# Patient Record
Sex: Male | Born: 1975
Health system: Southern US, Community
[De-identification: ages and names within clinical notes are randomized; demographics above are authoritative.]

## PROBLEM LIST (undated history)

## (undated) DIAGNOSIS — K143 Hypertrophy of tongue papillae: Secondary | ICD-10-CM

## (undated) DIAGNOSIS — G4733 Obstructive sleep apnea (adult) (pediatric): Secondary | ICD-10-CM

## (undated) DIAGNOSIS — E785 Hyperlipidemia, unspecified: Secondary | ICD-10-CM

## (undated) DIAGNOSIS — T7840XA Allergy, unspecified, initial encounter: Secondary | ICD-10-CM

## (undated) HISTORY — DX: Allergy, unspecified, initial encounter: T78.40XA

## (undated) HISTORY — DX: Hyperlipidemia, unspecified: E78.5

## (undated) HISTORY — DX: Hypertrophy of tongue papillae: K14.3

---
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
## 2006-09-20 ENCOUNTER — Emergency Department: Payer: Self-pay

## 2006-11-06 ENCOUNTER — Emergency Department: Payer: Self-pay | Admitting: General Practice

## 2008-08-03 ENCOUNTER — Ambulatory Visit: Payer: Self-pay | Admitting: Family Medicine

## 2008-10-22 DIAGNOSIS — J302 Other seasonal allergic rhinitis: Secondary | ICD-10-CM | POA: Insufficient documentation

## 2009-01-06 ENCOUNTER — Ambulatory Visit: Payer: Self-pay | Admitting: Urology

## 2009-03-05 ENCOUNTER — Ambulatory Visit: Payer: Self-pay | Admitting: Urology

## 2009-08-25 ENCOUNTER — Encounter: Admission: RE | Admit: 2009-08-25 | Discharge: 2009-08-25 | Payer: Self-pay | Admitting: Neurosurgery

## 2011-08-15 ENCOUNTER — Emergency Department: Payer: Self-pay | Admitting: Emergency Medicine

## 2015-08-26 ENCOUNTER — Encounter: Payer: Self-pay | Admitting: Family Medicine

## 2015-08-26 ENCOUNTER — Ambulatory Visit (INDEPENDENT_AMBULATORY_CARE_PROVIDER_SITE_OTHER): Payer: 59 | Admitting: Family Medicine

## 2015-08-26 VITALS — BP 110/80 | HR 106 | Temp 97.8°F | Resp 18 | Ht 71.0 in | Wt 171.2 lb

## 2015-08-26 DIAGNOSIS — M5412 Radiculopathy, cervical region: Secondary | ICD-10-CM | POA: Insufficient documentation

## 2015-08-26 DIAGNOSIS — Z841 Family history of disorders of kidney and ureter: Secondary | ICD-10-CM | POA: Diagnosis not present

## 2015-08-26 DIAGNOSIS — Z79899 Other long term (current) drug therapy: Secondary | ICD-10-CM

## 2015-08-26 DIAGNOSIS — N200 Calculus of kidney: Secondary | ICD-10-CM | POA: Insufficient documentation

## 2015-08-26 DIAGNOSIS — Z0000 Encounter for general adult medical examination without abnormal findings: Secondary | ICD-10-CM

## 2015-08-26 DIAGNOSIS — K143 Hypertrophy of tongue papillae: Secondary | ICD-10-CM | POA: Insufficient documentation

## 2015-08-26 DIAGNOSIS — Z13220 Encounter for screening for lipoid disorders: Secondary | ICD-10-CM | POA: Diagnosis not present

## 2015-08-26 DIAGNOSIS — Z130 Encounter for screening for diseases of the blood and blood-forming organs and certain disorders involving the immune mechanism: Secondary | ICD-10-CM | POA: Diagnosis not present

## 2015-08-26 DIAGNOSIS — Z131 Encounter for screening for diabetes mellitus: Secondary | ICD-10-CM | POA: Diagnosis not present

## 2015-08-26 DIAGNOSIS — B360 Pityriasis versicolor: Secondary | ICD-10-CM

## 2015-08-26 DIAGNOSIS — M5020 Other cervical disc displacement, unspecified cervical region: Secondary | ICD-10-CM | POA: Insufficient documentation

## 2015-08-26 MED ORDER — IBUPROFEN 800 MG PO TABS: 800.0000 mg | ORAL_TABLET | Freq: Three times a day (TID) | ORAL | Status: DC | PRN

## 2015-08-26 NOTE — Progress Notes (Signed)
Name: Nicolas White   MRN: [id number]    DOB: Nov 07, 1975   Date:08/26/2015       Progress Note  Subjective  Chief Complaint  Chief Complaint  Patient presents with  . Annual Exam    patient has questions about his immune system.   . Labs Only    HPI  Male Exam: he is feeling well, he would like to have lab work. No sexual dysfunction, no dysuria, no dribbling, no nocturia.   Neck pain: doing better, intermittent pain and tingling down left shoulder. Takes Ibuprofen prn and needs a refill.     Patient Active Problem List   Diagnosis Date Noted  . Displacement of cervical intervertebral disc without myelopathy 08/26/2015  . Calculus of kidney 08/26/2015  . Cervical radiculitis 08/26/2015  . Hypertrophy of tongue papillae 08/26/2015  . Allergic rhinitis 10/22/2008    History reviewed. No pertinent past surgical history.  Family History  Problem Relation Age of Onset  . Arthritis Mother   . Hypertension Mother   . Heart attack Mother     hx of light heart attack  . Hyperlipidemia Mother   . Cancer Father     lung  . Kidney disease Brother     had a transplant (hemodialysis)    Social History   Social History  . Marital Status: Married    Spouse Name: N/A  . Number of Children: N/A  . Years of Education: N/A   Occupational History  . Not on file.   Social History Main Topics  . Smoking status: Current Every Day Smoker  . Smokeless tobacco: Not on file     Comment: patient has chewed tobacco for 10 yrs.  . Alcohol Use: No  . Drug Use: No  . Sexual Activity:    Partners: Female   Other Topics Concern  . Not on file   Social History Narrative  . No narrative on file     Current outpatient prescriptions:  .  ibuprofen (ADVIL,MOTRIN) 800 MG tablet, Take 1 tablet (800 mg total) by mouth every 8 (eight) hours as needed., Disp: 90 tablet, Rfl: 0  No Known Allergies   ROS  Constitutional: Negative for fever, positive for  weight change.   Respiratory: Negative for cough and shortness of breath.   Cardiovascular: Negative for chest pain or palpitations.  Gastrointestinal: Negative for abdominal pain, no bowel changes.  Musculoskeletal: Negative for gait problem or joint swelling.  Skin: Negative for rash.  Neurological: Negative for dizziness or headache.  No other specific complaints in a complete review of systems (except as listed in HPI above).  Objective  Filed Vitals:   08/26/15 0833  BP: 110/80  Pulse: 106  Temp: 97.8 F (36.6 C)  TempSrc: Oral  Resp: 18  Height:  (1.803 m)  Weight: 171 lb 3.2 oz (77.656 kg)  SpO2: 97%    Body mass index is 23.89 kg/(m^2).  Physical Exam  Constitutional: Patient appears well-developed and well-nourished. No distress.  HENT: Head: Normocephalic and atraumatic. Ears: B TMs ok, no erythema or effusion; Nose: Nose normal. Mouth/Throat: Oropharynx is clear and moist. No oropharyngeal exudate.  Eyes: Conjunctivae and EOM are normal. Pupils are equal, round, and reactive to light. No scleral icterus.  Neck: Normal range of motion. Neck supple. No JVD present. No thyromegaly present.  Cardiovascular: Normal rate, regular rhythm and normal heart sounds.  No murmur heard. No BLE edema. Pulmonary/Chest: Effort normal and breath sounds normal. No respiratory distress. Abdominal:  Soft. Bowel sounds are normal, no distension. There is no tenderness. no masses MALE GENITALIA: Right testis moved upwards towards the inguinal area, but both descended, no masses palpated, no hernias, no lesions, no discharge RECTAL: discussed USPTF - he chose not to have it done Musculoskeletal: Normal range of motion, no joint effusions. No gross deformities Neurological: he is alert and oriented to person, place, and time. No cranial nerve deficit. Coordination, balance, strength, speech and gait are normal.  Skin: Skin is warm and dry.Hypochromic patches on his back- he does not want to be treated  No erythema.  Psychiatric: Patient has a normal mood and affect. behavior is normal. Judgment and thought content normal.  PHQ2/9: Depression screen PHQ 2/9 08/26/2015  Decreased Interest 0  Down, Depressed, Hopeless 0  PHQ - 2 Score 0    Fall Risk: Fall Risk  08/26/2015  Falls in the past year? No    Functional Status Survey: Is the patient deaf or have difficulty hearing?: No Does the patient have difficulty seeing, even when wearing glasses/contacts?: No Does the patient have difficulty concentrating, remembering, or making decisions?: No Does the patient have difficulty walking or climbing stairs?: No Does the patient have difficulty dressing or bathing?: No Does the patient have difficulty doing errands alone such as visiting a doctor's office or shopping?: No    Assessment & Plan  1. Physical exam, annual  Discussed importance of 150 minutes of physical activity weekly, eat two servings of fish weekly, eat one serving of tree nuts ( cashews, pistachios, pecans, almonds.Marland Kitchen) every other day, eat 6 servings of fruit/vegetables daily and drink plenty of water and avoid sweet beverages.   2. Encounter for screening for diabetes mellitus  Check glucose  3. Lipid screening  - Lipid Profile  4. Family history of renal failure  Check GFR  5. Screening, anemia, deficiency, iron  - Hematocrit  6. Encounter for long-term current use of medication  - Glucose - Comprehensive metabolic panel

## 2015-08-26 NOTE — Patient Instructions (Signed)
Discussed importance of 150 minutes of physical activity weekly, eat two servings of fish weekly, eat one serving of tree nuts ( cashews, pistachios, pecans, almonds..) every other day, eat 6 servings of fruit/vegetables daily and drink plenty of water and avoid sweet beverages. 

## 2015-08-27 ENCOUNTER — Telehealth: Payer: Self-pay

## 2015-08-27 ENCOUNTER — Other Ambulatory Visit: Payer: Self-pay | Admitting: Family Medicine

## 2015-08-27 DIAGNOSIS — E785 Hyperlipidemia, unspecified: Secondary | ICD-10-CM

## 2015-08-27 LAB — LIPID PANEL
Chol/HDL Ratio: 7.1 ratio units — ABNORMAL HIGH (ref 0.0–5.0)
Cholesterol, Total: 283 mg/dL — ABNORMAL HIGH (ref 100–199)
HDL: 40 mg/dL (ref >39)
LDL Calculated: 215 mg/dL — ABNORMAL HIGH (ref 0–99)
Triglycerides: 138 mg/dL (ref 0–149)
VLDL Cholesterol Cal: 28 mg/dL (ref 5–40)

## 2015-08-27 LAB — COMPREHENSIVE METABOLIC PANEL
ALT: 35 IU/L (ref 0–44)
AST: 25 IU/L (ref 0–40)
Albumin/Globulin Ratio: 1.5 (ref 1.1–2.5)
Albumin: 4.8 g/dL (ref 3.5–5.5)
Alkaline Phosphatase: 96 IU/L (ref 39–117)
BUN/Creatinine Ratio: 15 (ref 9–20)
BUN: 15 mg/dL (ref 6–24)
Bilirubin Total: 0.6 mg/dL (ref 0.0–1.2)
CO2: 27 mmol/L (ref 18–29)
Calcium: 9.9 mg/dL (ref 8.7–10.2)
Chloride: 98 mmol/L (ref 96–106)
Creatinine, Ser: 1.01 mg/dL (ref 0.76–1.27)
GFR calc Af Amer: 107 mL/min/{1.73_m2} (ref >59)
GFR calc non Af Amer: 93 mL/min/{1.73_m2} (ref >59)
Globulin, Total: 3.2 g/dL (ref 1.5–4.5)
Glucose: 92 mg/dL (ref 65–99)
Potassium: 4.1 mmol/L (ref 3.5–5.2)
Sodium: 140 mmol/L (ref 134–144)
Total Protein: 8 g/dL (ref 6.0–8.5)

## 2015-08-27 LAB — HEMATOCRIT: Hematocrit: 51.2 % — ABNORMAL HIGH (ref 37.5–51.0)

## 2015-08-27 MED ORDER — ATORVASTATIN CALCIUM 40 MG PO TABS: 40.0000 mg | ORAL_TABLET | Freq: Every day | ORAL | Status: DC

## 2015-08-27 NOTE — Telephone Encounter (Signed)
Patient returned my call and labs were reviewed.  Patient has been scheduled for an appt on 11/25/15 @ 3:20pm.

## 2015-09-01 ENCOUNTER — Telehealth: Payer: Self-pay | Admitting: Family Medicine

## 2015-09-01 NOTE — Telephone Encounter (Signed)
patient was prescribed Atorvastatin 40 mg and it is causing diarrhea. patient not sure if the dosage is to strong. Patient did not take the medication last night due to the reaction. Please advise

## 2015-09-01 NOTE — Telephone Encounter (Signed)
Ask him to wait until Feb 1st and try again half dose to see if he can tolerate it. There is a lot of gastroenteritis going around and it may not have been related to new medication

## 2015-11-25 ENCOUNTER — Ambulatory Visit (INDEPENDENT_AMBULATORY_CARE_PROVIDER_SITE_OTHER): Payer: 59 | Admitting: Family Medicine

## 2015-11-25 ENCOUNTER — Encounter: Payer: Self-pay | Admitting: Family Medicine

## 2015-11-25 VITALS — BP 118/76 | HR 81 | Temp 98.2°F | Resp 16 | Ht 71.0 in | Wt 169.4 lb

## 2015-11-25 DIAGNOSIS — M5412 Radiculopathy, cervical region: Secondary | ICD-10-CM | POA: Diagnosis not present

## 2015-11-25 DIAGNOSIS — Z79899 Other long term (current) drug therapy: Secondary | ICD-10-CM

## 2015-11-25 DIAGNOSIS — J302 Other seasonal allergic rhinitis: Secondary | ICD-10-CM

## 2015-11-25 DIAGNOSIS — E785 Hyperlipidemia, unspecified: Secondary | ICD-10-CM | POA: Diagnosis not present

## 2015-11-25 MED ORDER — ATORVASTATIN CALCIUM 40 MG PO TABS: 40.0000 mg | ORAL_TABLET | Freq: Every day | ORAL | Status: DC

## 2015-11-25 NOTE — Progress Notes (Signed)
Name: Nicolas AlexanderKenneth D Dobosz   MRN: [id number]    DOB: [birth year]   Date:11/25/2015       Progress Note  Subjective  Chief Complaint  Chief Complaint  Patient presents with  . Follow-up    patient is here for his 9191-month f/u regarding his cholesterol  . Medication Refill    atorvastatin    HPI  Neck pain: doing better, intermittent pain on left posterior neck,  and tingling down left shoulder. Takes Ibuprofen prn and needs a refill. No weakness.   AR: symptoms are seasonal, but doing well at this time, no nasal congestion or rhinorrhea  Hyperlipidemia: possible familial, he is only 39 yea and LDL was 215, he has changed his diet and  is taking Atorvastatin as prescribed. No side effects , except for some upset stomach in the first few days. He is not completely fasting today, had a bag of chips and a soda about 4 hours ago, but can't come back in am and would like to have labs done .   Patient Active Problem List   Diagnosis Date Noted  . Hyperlipidemia 08/27/2015  . Displacement of cervical intervertebral disc without myelopathy 08/26/2015  . Calculus of kidney 08/26/2015  . Cervical radiculitis 08/26/2015  . Hypertrophy of tongue papillae 08/26/2015  . Allergic rhinitis, seasonal 10/22/2008    History reviewed. No pertinent past surgical history.  Family History  Problem Relation Age of Onset  . Arthritis Mother   . Hypertension Mother   . Heart attack Mother     hx of light heart attack  . Hyperlipidemia Mother   . Cancer Father     lung  . Kidney disease Brother     had a transplant (hemodialysis)    Social History   Social History  . Marital Status: Married    Spouse Name: N/A  . Number of Children: N/A  . Years of Education: N/A   Occupational History  . Not on file.   Social History Main Topics  . Smoking status: Current Every Day Smoker  . Smokeless tobacco: Not on file     Comment: patient has chewed tobacco for 10 yrs.  . Alcohol Use: No  . Drug Use:  No  . Sexual Activity:    Partners: Female   Other Topics Concern  . Not on file   Social History Narrative     Current outpatient prescriptions:  .  atorvastatin (LIPITOR) 40 MG tablet, Take 1 tablet (40 mg total) by mouth daily., Disp: 30 tablet, Rfl: 0 .  ibuprofen (ADVIL,MOTRIN) 800 MG tablet, Take 1 tablet (800 mg total) by mouth every 8 (eight) hours as needed., Disp: 90 tablet, Rfl: 0  No Known Allergies   ROS  Ten systems reviewed and is negative except as mentioned in HPI   Objective  Filed Vitals:   11/25/15 1533  BP: 118/76  Pulse: 81  Temp: 98.2 F (36.8 C)  TempSrc: Oral  Resp: 16  Height: 5\' 11"  (1.803 m)  Weight: 169 lb 6.4 oz (76.839 kg)  SpO2: 98%    Body mass index is 23.64 kg/(m^2).  Physical Exam  Constitutional: Patient appears well-developed and well-nourished.  No distress.  HEENT: head atraumatic, normocephalic, pupils equal and reactive to light, neck supple, throat within normal limits Cardiovascular: Normal rate, regular rhythm and normal heart sounds.  No murmur heard. No BLE edema. Pulmonary/Chest: Effort normal and breath sounds normal. No respiratory distress. Abdominal: Soft.  There is no tenderness. Psychiatric: Patient has  a normal mood and affect. behavior is normal. Judgment and thought content normal.  PHQ2/9: Depression screen [facility] 2/9 11/25/2015 08/26/2015  Decreased Interest 0 0  Down, Depressed, Hopeless 0 0  PHQ - 2 Score 0 0     Fall Risk: Fall Risk  11/25/2015 08/26/2015  Falls in the past year? No No     Functional Status Survey: Is the patient deaf or have difficulty hearing?: No Does the patient have difficulty seeing, even when wearing glasses/contacts?: No Does the patient have difficulty concentrating, remembering, or making decisions?: No Does the patient have difficulty walking or climbing stairs?: No Does the patient have difficulty dressing or bathing?: No Does the patient have difficulty doing  errands alone such as visiting a doctor's office or shopping?: No    Assessment & Plan  1. Allergic rhinitis, seasonal  Doing well at this time  2. Hyperlipidemia  - atorvastatin (LIPITOR) 40 MG tablet; Take 1 tablet (40 mg total) by mouth daily.  Dispense: 30 tablet; Refill: 5 - Lipid Profile  3. Cervical radiculitis  Continue prn motrin   4. Long-term use of high-risk medication  - Comprehensive Metabolic Panel (CMET)

## 2016-01-04 ENCOUNTER — Telehealth: Payer: Self-pay | Admitting: Family Medicine

## 2016-01-04 NOTE — Telephone Encounter (Signed)
Lipitor was sent to his pharmacy in April, however when he called them it told him that it need authorization.

## 2016-01-04 NOTE — Telephone Encounter (Signed)
Left message for patient that [facility] ready for him to pick up with 5 refills and the prescription will be $10 monthly.

## 2016-02-24 ENCOUNTER — Ambulatory Visit: Payer: 59 | Admitting: Family Medicine

## 2016-05-26 ENCOUNTER — Encounter: Payer: Self-pay | Admitting: Family Medicine

## 2016-05-26 ENCOUNTER — Ambulatory Visit (INDEPENDENT_AMBULATORY_CARE_PROVIDER_SITE_OTHER): Payer: 59 | Admitting: Family Medicine

## 2016-05-26 VITALS — BP 110/74 | HR 81 | Temp 98.2°F | Resp 16 | Ht 71.0 in | Wt 168.2 lb

## 2016-05-26 DIAGNOSIS — Z79899 Other long term (current) drug therapy: Secondary | ICD-10-CM

## 2016-05-26 DIAGNOSIS — M62838 Other muscle spasm: Secondary | ICD-10-CM

## 2016-05-26 DIAGNOSIS — R9431 Abnormal electrocardiogram [ECG] [EKG]: Secondary | ICD-10-CM

## 2016-05-26 DIAGNOSIS — Z23 Encounter for immunization: Secondary | ICD-10-CM | POA: Diagnosis not present

## 2016-05-26 DIAGNOSIS — E7800 Pure hypercholesterolemia, unspecified: Secondary | ICD-10-CM | POA: Diagnosis not present

## 2016-05-26 LAB — COMPLETE METABOLIC PANEL WITH GFR
ALT: 23 U/L (ref 9–46)
AST: 23 U/L (ref 10–40)
Albumin: 4.9 g/dL (ref 3.6–5.1)
Alkaline Phosphatase: 89 U/L (ref 40–115)
BUN: 18 mg/dL (ref 7–25)
CO2: 29 mmol/L (ref 20–31)
Calcium: 9.8 mg/dL (ref 8.6–10.3)
Chloride: 100 mmol/L (ref 98–110)
Creat: 1.04 mg/dL (ref 0.60–1.35)
GFR, Est African American: >89 mL/min (ref >=60)
GFR, Est Non African American: 89 mL/min (ref >=60)
Glucose, Bld: 86 mg/dL (ref 65–99)
Potassium: 4.2 mmol/L (ref 3.5–5.3)
Sodium: 139 mmol/L (ref 135–146)
Total Bilirubin: 1 mg/dL (ref 0.2–1.2)
Total Protein: 8 g/dL (ref 6.1–8.1)

## 2016-05-26 LAB — LIPID PANEL
Cholesterol: 180 mg/dL (ref 125–200)
HDL: 45 mg/dL (ref >=40)
LDL Cholesterol: 121 mg/dL (ref <130)
Total CHOL/HDL Ratio: 4 Ratio (ref <=5.0)
Triglycerides: 72 mg/dL (ref <150)
VLDL: 14 mg/dL (ref <30)

## 2016-05-26 MED ORDER — METAXALONE 800 MG PO TABS: 800.0000 mg | ORAL_TABLET | Freq: Three times a day (TID) | ORAL | 0 refills | Status: DC

## 2016-05-26 MED ORDER — IBUPROFEN 800 MG PO TABS: 800.0000 mg | ORAL_TABLET | Freq: Three times a day (TID) | ORAL | 0 refills | Status: DC | PRN

## 2016-05-26 NOTE — Progress Notes (Signed)
Name: Nicolas White   MRN: [id number]    DOB: [birth year]   Date:05/26/2016       Progress Note  Subjective  Chief Complaint  Chief Complaint  Patient presents with  . Hyperlipidemia    Follow up     HPI  Hypercholesterinemia: it may be familial, he was started on Lipitor January 2017, initially had indigestion with medication, but doing well now, no side effects. No chest pain, palpitation, leg cramps  Neck spasms: he has history of radiculitis but it used to be on left side, he states pain is different today.  She states she noticed some spasms last night and is still bothering him now, no rashes. It feels a pulling sensation on right side of his neck. No tingling or numbness.    Patient Active Problem List   Diagnosis Date Noted  . Hyperlipidemia 08/27/2015  . Displacement of cervical intervertebral disc without myelopathy 08/26/2015  . Calculus of kidney 08/26/2015  . Cervical radiculitis 08/26/2015  . Hypertrophy of tongue papillae 08/26/2015  . Allergic rhinitis, seasonal 10/22/2008    History reviewed. No pertinent surgical history.  Family History  Problem Relation Age of Onset  . Arthritis Mother   . Hypertension Mother   . Heart attack Mother     hx of light heart attack  . Hyperlipidemia Mother   . Cancer Father     lung  . Kidney disease Brother     had a transplant (hemodialysis)    Social History   Social History  . Marital status: Married    Spouse name: N/A  . Number of children: N/A  . Years of education: N/A   Occupational History  . Not on file.   Social History Main Topics  . Smoking status: Never Smoker  . Smokeless tobacco: Current User    Types: Chew     Comment: patient has chewed tobacco for 10 yrs.  . Alcohol use No  . Drug use: No  . Sexual activity: Yes    Partners: Female   Other Topics Concern  . Not on file   Social History Narrative  . No narrative on file     Current Outpatient Prescriptions:  .  atorvastatin  (LIPITOR) 40 MG tablet, Take 1 tablet (40 mg total) by mouth daily., Disp: 30 tablet, Rfl: 5 .  ibuprofen (ADVIL,MOTRIN) 800 MG tablet, Take 1 tablet (800 mg total) by mouth every 8 (eight) hours as needed., Disp: 90 tablet, Rfl: 0 .  metaxalone (SKELAXIN) 800 MG tablet, Take 1 tablet (800 mg total) by mouth 3 (three) times daily., Disp: 60 tablet, Rfl: 0  No Known Allergies   ROS  Ten systems reviewed and is negative except as mentioned in HPI   Objective  Vitals:   05/26/16 0936  BP: 110/74  Pulse: 81  Resp: 16  Temp: 98.2 F (36.8 C)  TempSrc: Oral  SpO2: 98%  Weight: 168 lb 4 oz (76.3 kg)  Height: 5\' 11"  (1.803 m)    Body mass index is 23.47 kg/m.  Physical Exam  Constitutional: Patient appears well-developed and well-nourished.  No distress.  HEENT: head atraumatic, normocephalic, pupils equal and reactive to light,  neck supple, throat within normal limits Cardiovascular: Normal rate, regular rhythm and normal heart sounds.  No murmur heard. No BLE edema. Pulmonary/Chest: Effort normal and breath sounds normal. No respiratory distress. Abdominal: Soft.  There is no tenderness. Muscular skeletal: spasms of right sternocleidomastoid.  Psychiatric: Patient has a normal mood and  affect. behavior is normal. Judgment and thought content normal.  PHQ2/9: [facility] 2/9 05/26/2016 11/25/2015 08/26/2015  Decreased Interest 0 0 0  Down, Depressed, Hopeless 0 0 0  PHQ - 2 Score 0 0 0     Fall Risk: Fall Risk  05/26/2016 11/25/2015 08/26/2015  Falls in the past year? No No No     Functional Status Survey: Is the patient deaf or have difficulty hearing?: No Does the patient have difficulty seeing, even when wearing glasses/contacts?: No Does the patient have difficulty concentrating, remembering, or making decisions?: No Does the patient have difficulty walking or climbing stairs?: No Does the patient have difficulty dressing or bathing?: No Does the patient  have difficulty doing errands alone such as visiting a doctor's office or shopping?: No    Assessment & Plan  1. Pure hypercholesterolemia  - Lipid panel - EKG 12-Lead  2. Needs flu shot  - Flu Vaccine QUAD 36+ mos PF IM (Fluarix & Fluzone Quad PF)  3. Neck muscle spasm  - ibuprofen (ADVIL,MOTRIN) 800 MG tablet; Take 1 tablet (800 mg total) by mouth every 8 (eight) hours as needed.  Dispense: 90 tablet; Refill: 0 - metaxalone (SKELAXIN) 800 MG tablet; Take 1 tablet (800 mg total) by mouth 3 (three) times daily.  Dispense: 60 tablet; Refill: 0  4. Long-term use of high-risk medication  - COMPLETE METABOLIC PANEL WITH GFR   5. Abnormal EKG  He denies family history of early onset heart disease, he has high LDL but is able to run and denies chest pain, SOB or decrease in exercise tolerance. Discussed referral to cardiologist for further evaluation, but he would like to hold off. We will wait for labs, if LDL still very high he will re-consider

## 2016-08-26 ENCOUNTER — Emergency Department: Payer: Managed Care, Other (non HMO)

## 2016-08-26 ENCOUNTER — Emergency Department
Admission: EM | Admit: 2016-08-26 | Discharge: 2016-08-26 | Disposition: A | Discharge status: Home / Self Care | Payer: Managed Care, Other (non HMO) | Attending: Emergency Medicine | Admitting: Emergency Medicine

## 2016-08-26 DIAGNOSIS — R109 Unspecified abdominal pain: Secondary | ICD-10-CM

## 2016-08-26 DIAGNOSIS — N200 Calculus of kidney: Secondary | ICD-10-CM | POA: Diagnosis not present

## 2016-08-26 DIAGNOSIS — F17220 Nicotine dependence, chewing tobacco, uncomplicated: Secondary | ICD-10-CM | POA: Insufficient documentation

## 2016-08-26 DIAGNOSIS — R1031 Right lower quadrant pain: Secondary | ICD-10-CM | POA: Diagnosis present

## 2016-08-26 LAB — URINALYSIS, COMPLETE (UACMP) WITH MICROSCOPIC
Bilirubin Urine: NEGATIVE
Glucose, UA: NEGATIVE mg/dL
Ketones, ur: NEGATIVE mg/dL
Leukocytes, UA: NEGATIVE
Nitrite: NEGATIVE
Protein, ur: 30 mg/dL — AB
Specific Gravity, Urine: 1.019 (ref 1.005–1.030)
pH: 5 (ref 5.0–8.0)

## 2016-08-26 LAB — BASIC METABOLIC PANEL WITH GFR
Anion gap: 7 (ref 5–15)
BUN: 14 mg/dL (ref 6–20)
CO2: 29 mmol/L (ref 22–32)
Calcium: 9.1 mg/dL (ref 8.9–10.3)
Chloride: 103 mmol/L (ref 101–111)
Creatinine, Ser: 1.02 mg/dL (ref 0.61–1.24)
GFR calc Af Amer: >60 mL/min (ref >60)
GFR calc non Af Amer: >60 mL/min (ref >60)
Glucose, Bld: 130 mg/dL — ABNORMAL HIGH (ref 65–99)
Potassium: 3.7 mmol/L (ref 3.5–5.1)
Sodium: 139 mmol/L (ref 135–145)

## 2016-08-26 LAB — CBC
HCT: 51.4 % (ref 40.0–52.0)
Hemoglobin: 17.9 g/dL (ref 13.0–18.0)
MCH: 30.3 pg (ref 26.0–34.0)
MCHC: 34.8 g/dL (ref 32.0–36.0)
MCV: 87 fL (ref 80.0–100.0)
Platelets: 323 K/uL (ref 150–440)
RBC: 5.91 MIL/uL — ABNORMAL HIGH (ref 4.40–5.90)
RDW: 12.6 % (ref 11.5–14.5)
WBC: 7.2 K/uL (ref 3.8–10.6)

## 2016-08-26 MED ORDER — ONDANSETRON HCL 4 MG/2ML IJ SOLN
4.0000 mg | Freq: Once | INTRAMUSCULAR | Status: AC
  Administered 2016-08-26: 4 mg via INTRAVENOUS
  Filled 2016-08-26: qty 2

## 2016-08-26 MED ORDER — TAMSULOSIN HCL 0.4 MG PO CAPS: 0.4000 mg | ORAL_CAPSULE | Freq: Every day | ORAL | 0 refills | Status: DC

## 2016-08-26 MED ORDER — OXYCODONE-ACETAMINOPHEN 5-325 MG PO TABS
1.0000 | ORAL_TABLET | Freq: Once | ORAL | Status: AC
  Administered 2016-08-26: 1 via ORAL
  Filled 2016-08-26: qty 1

## 2016-08-26 MED ORDER — KETOROLAC TROMETHAMINE 30 MG/ML IJ SOLN
30.0000 mg | Freq: Once | INTRAMUSCULAR | Status: AC
  Administered 2016-08-26: 30 mg via INTRAVENOUS

## 2016-08-26 MED ORDER — IBUPROFEN 800 MG PO TABS: 800.0000 mg | ORAL_TABLET | Freq: Three times a day (TID) | ORAL | 0 refills | Status: DC | PRN

## 2016-08-26 MED ORDER — ONDANSETRON 4 MG PO TBDP: 4.0000 mg | ORAL_TABLET | Freq: Three times a day (TID) | ORAL | 0 refills | Status: DC | PRN

## 2016-08-26 MED ORDER — OXYCODONE-ACETAMINOPHEN 5-325 MG PO TABS: 1.0000 | ORAL_TABLET | ORAL | 0 refills | Status: DC | PRN

## 2016-08-26 MED ORDER — SODIUM CHLORIDE 0.9 % IV BOLUS (SEPSIS)
1000.0000 mL | Freq: Once | INTRAVENOUS | Status: AC
  Administered 2016-08-26: 1000 mL via INTRAVENOUS

## 2016-08-26 MED ORDER — ONDANSETRON 4 MG PO TBDP
4.0000 mg | ORAL_TABLET | Freq: Once | ORAL | Status: AC
  Administered 2016-08-26: 4 mg via ORAL
  Filled 2016-08-26: qty 1

## 2016-08-26 MED ORDER — TAMSULOSIN HCL 0.4 MG PO CAPS
0.4000 mg | ORAL_CAPSULE | Freq: Once | ORAL | Status: AC
  Administered 2016-08-26: 0.4 mg via ORAL
  Filled 2016-08-26: qty 1

## 2016-08-26 MED ORDER — HYDROMORPHONE HCL 1 MG/ML IJ SOLN
1.0000 mg | Freq: Once | INTRAMUSCULAR | Status: AC
  Administered 2016-08-26: 1 mg via INTRAVENOUS
  Filled 2016-08-26: qty 1

## 2016-08-26 MED ORDER — KETOROLAC TROMETHAMINE 30 MG/ML IJ SOLN
INTRAMUSCULAR | Status: AC
  Filled 2016-08-26: qty 1

## 2016-08-26 NOTE — ED Triage Notes (Addendum)
Pt presents to ED with c/o RLQ abdominal pain that started suddenly last night. Pt denies any c/o N/V/D, denies any c/o CP or shortness of breath. Pt reports pain is intermittent, with radiation into right flank. Pt reports sick with flu last week and dealing with constipation, last BM was tonight PTA. Pt denies any change in urinary habits or frequency.

## 2016-08-26 NOTE — ED Notes (Signed)

## 2016-08-26 NOTE — ED Provider Notes (Signed)
[facility] Emergency Department Provider Note   ____________________________________________   First MD Initiated Contact with Patient 08/26/16 0131     (approximate)  I have reviewed the triage vital signs and the nursing notes.   HISTORY  Chief Complaint Abdominal Pain    HPI Nicolas White is a 41 y.o. male who presents to the ED from home with a chief complaint of right flank and right lower quadrant abdominal pain. Patient reports sudden onset of right lower quadrant pain radiating to right flank last evening. Symptoms described as sharp without associated nausea/vomiting/diarrhea/urinary symptoms. Denies associated fever, chills, chest pain, shortness of breath. Denies recent travel or trauma. History of kidney stones previously. Nothing makes his symptoms better or worse.   Past Medical History:  Diagnosis Date  . Allergy   . Hyperlipidemia   . Hypertrophy, tongue, papillae     Patient Active Problem List   Diagnosis Date Noted  . Hyperlipidemia 08/27/2015  . Displacement of cervical intervertebral disc without myelopathy 08/26/2015  . Calculus of kidney 08/26/2015  . Cervical radiculitis 08/26/2015  . Hypertrophy of tongue papillae 08/26/2015  . Allergic rhinitis, seasonal 10/22/2008    History reviewed. No pertinent surgical history.  Prior to Admission medications   Medication Sig Start Date End Date Taking? Authorizing Provider  atorvastatin (LIPITOR) 40 MG tablet Take 1 tablet (40 mg total) by mouth daily. 11/25/15   Alba Cory, MD  ibuprofen (ADVIL,MOTRIN) 800 MG tablet Take 1 tablet (800 mg total) by mouth every 8 (eight) hours as needed for moderate pain. 08/26/16   Irean Hong, MD  metaxalone (SKELAXIN) 800 MG tablet Take 1 tablet (800 mg total) by mouth 3 (three) times daily. 05/26/16   Alba Cory, MD  ondansetron (ZOFRAN ODT) 4 MG disintegrating tablet Take 1 tablet (4 mg total) by mouth every 8 (eight) hours as needed  for nausea or vomiting. 08/26/16   Irean Hong, MD  oxyCODONE-acetaminophen (ROXICET) 5-325 MG tablet Take 1 tablet by mouth every 4 (four) hours as needed for severe pain. 08/26/16   Irean Hong, MD  tamsulosin (FLOMAX) 0.4 MG CAPS capsule Take 1 capsule (0.4 mg total) by mouth daily. 08/26/16   Irean Hong, MD    Allergies Patient has no known allergies.  Family History  Problem Relation Age of Onset  . Arthritis Mother   . Hypertension Mother   . Heart attack Mother     hx of light heart attack  . Hyperlipidemia Mother   . Cancer Father     lung  . Kidney disease Brother     had a transplant (hemodialysis)    Social History Social History  Substance Use Topics  . Smoking status: Never Smoker  . Smokeless tobacco: Current User    Types: Chew     Comment: patient has chewed tobacco for 10 yrs.  . Alcohol use No    Review of Systems  Constitutional: No fever/chills. Eyes: No visual changes. ENT: No sore throat. Cardiovascular: Denies chest pain. Respiratory: Denies shortness of breath. Gastrointestinal: Positive for abdominal and flank pain.  No nausea, no vomiting.  No diarrhea.  No constipation. Genitourinary: Negative for dysuria. Musculoskeletal: Negative for back pain. Skin: Negative for rash. Neurological: Negative for headaches, focal weakness or numbness.  10-point ROS otherwise negative.  ____________________________________________   PHYSICAL EXAM:  VITAL SIGNS: ED Triage Vitals [08/26/16 0059]  Enc Vitals Group     BP (!) 147/93     Pulse Rate Marland Kitchen)  107     Resp 18     Temp 98.5 F (36.9 C)     Temp Source Oral     SpO2 99 %     Weight 172 lb (78 kg)     Height 5\' 10"  (1.778 m)     Head Circumference      Peak Flow      Pain Score 10     Pain Loc      Pain Edu?      Excl. in GC?     Constitutional: Alert and oriented. Uncomfortable appearing and in moderate acute distress. Leg hanging on the stretcher arm secondary to discomfort. Eyes:  Conjunctivae are normal. PERRL. EOMI. Head: Atraumatic. Nose: No congestion/rhinnorhea. Mouth/Throat: Mucous membranes are moist.  Oropharynx non-erythematous. Neck: No stridor.   Cardiovascular: Normal rate, regular rhythm. Grossly normal heart sounds.  Good peripheral circulation. Respiratory: Normal respiratory effort.  No retractions. Lungs CTAB. Gastrointestinal: Soft and minimally tender to palpation right lower quadrant without rebound or guarding. No distention. No abdominal bruits. Mild right CVA tenderness. Musculoskeletal: No lower extremity tenderness nor edema.  No joint effusions. Neurologic:  Normal speech and language. No gross focal neurologic deficits are appreciated. No gait instability. Skin:  Skin is warm, dry and intact. No rash noted. Psychiatric: Mood and affect are normal. Speech and behavior are normal.  ____________________________________________   LABS (all labs ordered are listed, but only abnormal results are displayed)  Labs Reviewed  URINALYSIS, COMPLETE (UACMP) WITH MICROSCOPIC - Abnormal; Notable for the following:       Result Value   Color, Urine YELLOW (*)    APPearance HAZY (*)    Hgb urine dipstick LARGE (*)    Protein, ur 30 (*)    Bacteria, UA RARE (*)    Squamous Epithelial / LPF 0-5 (*)    All other components within normal limits  BASIC METABOLIC PANEL - Abnormal; Notable for the following:    Glucose, Bld 130 (*)    All other components within normal limits  CBC - Abnormal; Notable for the following:    RBC 5.91 (*)    All other components within normal limits   ____________________________________________  EKG  None ____________________________________________  RADIOLOGY  CT renal stone study interpreted per Dr. Manus GunningEhinger: 1. Obstructing 4 x 5 mm stone in the right mid ureter with mild  proximal hydroureteronephrosis.  2. Fecalization of distal small bowel contents and moderate stool  burden, suggesting slow  transit/constipation.   ____________________________________________   PROCEDURES  Procedure(s) performed: None  Procedures  Critical Care performed: No  ____________________________________________   INITIAL IMPRESSION / ASSESSMENT AND PLAN / ED COURSE  Pertinent labs & imaging results that were available during my care of the patient were reviewed by me and considered in my medical decision making (see chart for details).  41 year old male who presents with right lower quadrant of right flank pain; history of kidney stones. Laboratory urinalysis results remarkable for TNTC RBC; CT renal stone study pending results. IV Dilaudid "took the edge off" but pain returned; IV Toradol administered. Patient currently resting awaiting CT results.  Clinical Course as of Aug 26 336  Sat Aug 26, 2016  [id number] Patient sleeping soundly. Discussed with spouse CT results and given strict return precautions. Spouse verbalizes understanding and agrees with plan of care.  [JS]    Clinical Course User Index [JS] Irean HongJade J Sung, MD     ____________________________________________   FINAL CLINICAL IMPRESSION(S) / ED DIAGNOSES  Final  diagnoses:  Flank pain  Kidney stone      NEW MEDICATIONS STARTED DURING THIS VISIT:  New Prescriptions   IBUPROFEN (ADVIL,MOTRIN) 800 MG TABLET    Take 1 tablet (800 mg total) by mouth every 8 (eight) hours as needed for moderate pain.   ONDANSETRON (ZOFRAN ODT) 4 MG DISINTEGRATING TABLET    Take 1 tablet (4 mg total) by mouth every 8 (eight) hours as needed for nausea or vomiting.   OXYCODONE-ACETAMINOPHEN (ROXICET) 5-325 MG TABLET    Take 1 tablet by mouth every 4 (four) hours as needed for severe pain.   TAMSULOSIN (FLOMAX) 0.4 MG CAPS CAPSULE    Take 1 capsule (0.4 mg total) by mouth daily.     Note:  This document was prepared using Dragon voice recognition software and may include unintentional dictation errors.    Irean Hong, MD 08/26/16 [phone]

## 2016-08-26 NOTE — ED Notes (Signed)
Spoke with Dolores FrameSung, MD regarding presenting c/o and RN assessment. PMH significant for urolithiasis. UA demonstrates demonstrated significant microscopic hematuria; TNTC red cells in sample. Dolores FrameSung, MD with [facility] to proceed to CT stone study; order to be entered by this RN.

## 2016-08-26 NOTE — ED Notes (Signed)
Patient to CT at this time for MD ordered stone study. Visitor at bedside reports that patient seemed like "the edge was taken off" prior to him going over for scan. RN to follow up with patient when he returns.

## 2016-08-26 NOTE — Discharge Instructions (Signed)
1. Take pain & nausea medicines as needed (Percocet/Zofran #30). Make sure to take a stool softener while taking narcotic pain medicines. 2. Take Flomax 0.4mg daily x 14 days. 3. Drink plenty of bottled or filtered water daily. 4. Return to the ER for worsening symptoms, persistent vomiting, fever, difficulty breathing or other concerns.  

## 2016-08-31 ENCOUNTER — Ambulatory Visit (INDEPENDENT_AMBULATORY_CARE_PROVIDER_SITE_OTHER): Payer: Managed Care, Other (non HMO) | Admitting: Urology

## 2016-08-31 ENCOUNTER — Encounter: Payer: Self-pay | Admitting: Urology

## 2016-08-31 VITALS — BP 139/90 | HR 91 | Ht 69.0 in | Wt 169.8 lb

## 2016-08-31 DIAGNOSIS — R310 Gross hematuria: Secondary | ICD-10-CM

## 2016-08-31 DIAGNOSIS — N201 Calculus of ureter: Secondary | ICD-10-CM

## 2016-08-31 DIAGNOSIS — N132 Hydronephrosis with renal and ureteral calculous obstruction: Secondary | ICD-10-CM

## 2016-08-31 LAB — URINALYSIS, COMPLETE
Bilirubin, UA: NEGATIVE
Glucose, UA: NEGATIVE
Ketones, UA: NEGATIVE
Leukocytes, UA: NEGATIVE
Nitrite, UA: NEGATIVE
Protein, UA: NEGATIVE
Specific Gravity, UA: 1.015 (ref 1.005–1.030)
Urobilinogen, Ur: 0.2 mg/dL (ref 0.2–1.0)
pH, UA: 7 (ref 5.0–7.5)

## 2016-08-31 LAB — MICROSCOPIC EXAMINATION
Bacteria, UA: NONE SEEN
Epithelial Cells (non renal): NONE SEEN /hpf (ref 0–10)

## 2016-08-31 MED ORDER — TAMSULOSIN HCL 0.4 MG PO CAPS: 0.4000 mg | ORAL_CAPSULE | Freq: Every day | ORAL | 0 refills | Status: DC

## 2016-08-31 NOTE — Progress Notes (Signed)
08/31/2016 1:31 PM   Nicolas White 07-09-1976 [id number]  Referring provider: Steele Sizer, MD [address] [place], [place] [address]  Chief Complaint  Patient presents with  . New Patient (Initial Visit)    kidney stone referred by ER    HPI: Patient is a 41 year old African American who presents/is referred by [facility] ED for nephrolithiasis.  Patient states the onset of the pain was 5 days.   It was sharp.  It lasted for several minutes on an intermittent basis.  The pain was located right flank and radiated to right lower quadrant.  The pain was a 7/10.  Nothing made the pain better.   Nothing made the pain worse.  He did have nausea and vomiting.  In the ED, he received tamsulosin, Zofran and Percocet.  His UA noted TNTC RBC's .  Serum creatinine 1.02.  WBC count 7.2.    CT Renal stone study performed on 08/26/2016 noted an obstructing 4 x 5 mm stone in the right mid ureter with mild proximal hydroureteronephrosis.  Fecalization of distal small bowel contents and moderate stool burden, suggesting slow transit/constipation.  I have independently reviewed the films.    Today, he is not experiencing any symptoms.  He has not passed a fragment.  He has a strainer and has been straining.  His UA today is unremarkable.    He does not have a prior history of stones.     PMH: Past Medical History:  Diagnosis Date  . Allergy   . Hyperlipidemia   . Hypertrophy, tongue, papillae     Surgical History: History reviewed. No pertinent surgical history.  Home Medications:  Allergies as of 08/31/2016   No Known Allergies     Medication List       Accurate as of 08/31/16  1:31 PM. Always use your most recent med list.          atorvastatin 40 MG tablet Commonly known as:  LIPITOR Take 1 tablet (40 mg total) by mouth daily.   fluticasone 50 MCG/ACT nasal spray Commonly known as:  FLONASE Place into the nose.   HYDROcodone-homatropine 5-1.5 MG/5ML  syrup Commonly known as:  HYCODAN Take by mouth.   ibuprofen 800 MG tablet Commonly known as:  ADVIL,MOTRIN Take 1 tablet (800 mg total) by mouth every 8 (eight) hours as needed for moderate pain.   metaxalone 800 MG tablet Commonly known as:  SKELAXIN Take 1 tablet (800 mg total) by mouth 3 (three) times daily.   ondansetron 4 MG disintegrating tablet Commonly known as:  ZOFRAN ODT Take 1 tablet (4 mg total) by mouth every 8 (eight) hours as needed for nausea or vomiting.   oxyCODONE-acetaminophen 5-325 MG tablet Commonly known as:  ROXICET Take 1 tablet by mouth every 4 (four) hours as needed for severe pain.   tamsulosin 0.4 MG Caps capsule Commonly known as:  FLOMAX Take 1 capsule (0.4 mg total) by mouth daily.       Allergies: No Known Allergies  Family History: Family History  Problem Relation Age of Onset  . Arthritis Mother   . Hypertension Mother   . Heart attack Mother     hx of light heart attack  . Hyperlipidemia Mother   . Cancer Father     lung  . Kidney disease Brother     had a transplant (hemodialysis)  . Prostate cancer Neg Hx     Social History:  reports that he has never  smoked. His smokeless tobacco use includes Chew. He reports that he does not drink alcohol or use drugs.  ROS: UROLOGY Frequent Urination?: No Hard to postpone urination?: No Burning/pain with urination?: No Get up at night to urinate?: No Leakage of urine?: No Urine stream starts and stops?: No Trouble starting stream?: No Do you have to strain to urinate?: No Blood in urine?: Yes Urinary tract infection?: No Sexually transmitted disease?: No Injury to kidneys or bladder?: No Painful intercourse?: No Weak stream?: No Erection problems?: No Penile pain?: No  Gastrointestinal Nausea?: No Vomiting?: No Indigestion/heartburn?: No Diarrhea?: No Constipation?: Yes  Constitutional Fever: No Night sweats?: No Weight loss?: No Fatigue?: No  Skin Skin  rash/lesions?: No Itching?: No  Eyes Blurred vision?: No Double vision?: No  Ears/Nose/Throat Sore throat?: No Sinus problems?: No  Hematologic/Lymphatic Swollen glands?: No Easy bruising?: No  Cardiovascular Leg swelling?: No Chest pain?: No  Respiratory Cough?: Yes Shortness of breath?: No  Endocrine Excessive thirst?: No  Musculoskeletal Back pain?: No Joint pain?: No  Neurological Headaches?: No Dizziness?: No  Psychologic Depression?: No Anxiety?: No  Physical Exam: BP 139/90   Pulse 91   Ht 5\' 9"  (1.753 m)   Wt 169 lb 12.8 oz (77 kg)   BMI 25.08 kg/m   Constitutional: Well nourished. Alert and oriented, No acute distress. HEENT: [place] AT, moist mucus membranes. Trachea midline, no masses. Cardiovascular: No clubbing, cyanosis, or edema. Respiratory: Normal respiratory effort, no increased work of breathing. GI: Abdomen is soft, non tender, non distended, no abdominal masses. Liver and spleen not palpable.  No hernias appreciated.  Stool sample for occult testing is not indicated.   GU: No CVA tenderness.  No bladder fullness or masses.   Skin: No rashes, bruises or suspicious lesions. Lymph: No cervical or inguinal adenopathy. Neurologic: Grossly intact, no focal deficits, moving all 4 extremities. Psychiatric: Normal mood and affect.  Laboratory Data: Lab Results  Component Value Date   WBC 7.2 08/26/2016   HGB 17.9 08/26/2016   HCT 51.4 08/26/2016   MCV 87.0 08/26/2016   PLT 323 08/26/2016    Lab Results  Component Value Date   CREATININE 1.02 08/26/2016       Component Value Date/Time   CHOL 180 05/26/2016 1032   CHOL 283 (H) 08/26/2015 0915   HDL 45 05/26/2016 1032   HDL 40 08/26/2015 0915   CHOLHDL 4.0 05/26/2016 1032   VLDL 14 05/26/2016 1032   LDLCALC 121 05/26/2016 1032   LDLCALC 215 (H) 08/26/2015 0915    Lab Results  Component Value Date   AST 23 05/26/2016   Lab Results  Component Value Date   ALT 23 05/26/2016      Urinalysis Unremarkable.  See EPIC.    Pertinent Imaging: CLINICAL DATA:  Sudden onset of right flank and right lower quadrant pain.  EXAM: CT ABDOMEN AND PELVIS WITHOUT CONTRAST  TECHNIQUE: Multidetector CT imaging of the abdomen and pelvis was performed following the standard protocol without IV contrast.  COMPARISON:  None.  FINDINGS: Lower chest: The lung bases are clear.  Hepatobiliary: No focal liver abnormality is seen. No gallstones, gallbladder wall thickening, or biliary dilatation.  Pancreas: No ductal dilatation or inflammation.  Spleen: Normal in size without focal abnormality.  Adrenals/Urinary Tract: Obstructing 4 x 5 mm stone in the mid right ureter at the level of L4 with mild proximal hydroureteronephrosis. Mild perinephric edema. No additional nonobstructing stone is seen in either kidney. There is no left hydronephrosis. Left  ureter is decompressed. Subcentimeter low-density in the lower right kidney may be a cyst or calyceal diverticulum. Urinary bladder is decompressed. The adrenal glands are normal.  Stomach/Bowel: Stomach is physiologically distended. There is mild fecalization of distal small bowel contents consistent with slow transit. No wall thickening, inflammation or obstruction. Moderate diffuse stool burden throughout the colon. No colonic inflammation or wall thickening. The appendix is not confidently identified. There is no pericecal or right lower quadrant inflammation.  Vascular/Lymphatic: No significant vascular findings are present. No enlarged abdominal or pelvic lymph nodes.  Reproductive: Prostate is unremarkable. Minimal soft tissue density in the left inguinal canal may simply be prominent pampiniform plexus.  Other: No free air, free fluid, or intra-abdominal fluid collection.  Musculoskeletal: There are no acute or suspicious osseous abnormalities.  IMPRESSION: 1. Obstructing 4 x 5 mm stone in  the right mid ureter with mild proximal hydroureteronephrosis. 2. Fecalization of distal small bowel contents and moderate stool burden, suggesting slow transit/constipation.   Electronically Signed   By: Jeb Levering M.D.   On: 08/26/2016 02:49   Assessment & Plan:    1. Right ureteral stone  - patient with continue MET therapy at this time  - he will continue to strain the urine and bring in any fragments he captures  - he will RTC in one week for KUB   - Advised to contact our office or seek treatment in the ED if becomes febrile or pain/ vomiting are difficult control in order to arrange for emergent/urgent intervention  2. Right hydronephrosis  - obtain RUS to ensure the hydronephrosis has resolved once stone has passed  3. Gross hematuria  - UA today demonstrates 0-2 RBC's  - continue to monitor the patient's UA after the treatment/passage of the stone to ensure the hematuria has resolved  - if hematuria persists, we will pursue a hematuria workup with CT Urogram and cystoscopy if appropriate.  Return in about 1 week (around 09/07/2016) for KUB.  These notes generated with voice recognition software. I apologize for typographical errors.  Zara Council, [facility] [address] [place], [place] [address] [phone]

## 2016-09-03 LAB — CULTURE, URINE COMPREHENSIVE

## 2016-09-06 ENCOUNTER — Ambulatory Visit
Admission: RE | Admit: 2016-09-06 | Discharge: 2016-09-06 | Disposition: A | Discharge status: Home / Self Care | Payer: Managed Care, Other (non HMO) | Source: Ambulatory Visit | Attending: Urology | Admitting: Urology

## 2016-09-06 ENCOUNTER — Ambulatory Visit (INDEPENDENT_AMBULATORY_CARE_PROVIDER_SITE_OTHER): Payer: Managed Care, Other (non HMO) | Admitting: Urology

## 2016-09-06 ENCOUNTER — Encounter: Payer: Self-pay | Admitting: Urology

## 2016-09-06 VITALS — BP 122/80 | HR 79 | Ht 69.0 in | Wt 169.5 lb

## 2016-09-06 DIAGNOSIS — R310 Gross hematuria: Secondary | ICD-10-CM | POA: Diagnosis not present

## 2016-09-06 DIAGNOSIS — N201 Calculus of ureter: Secondary | ICD-10-CM | POA: Insufficient documentation

## 2016-09-06 DIAGNOSIS — N132 Hydronephrosis with renal and ureteral calculous obstruction: Secondary | ICD-10-CM

## 2016-09-06 NOTE — Progress Notes (Signed)
09/06/2016 3:51 PM   Nicolas White [birth year] [id number]  Referring provider: Alba Cory, MD [address] [place], [place] [address]  Chief Complaint  Patient presents with  . Follow-up    KUB results    HPI: Patient is a 41 year old African American who presents today to review his KUB results.  Background history Patient was referred by [facility] ED for nephrolithiasis.  Patient states the onset of the pain was 5 days.   It was sharp.  It lasted for several minutes on an intermittent basis.  The pain was located right flank and radiated to right lower quadrant.  The pain was a 7/10.  Nothing made the pain better.   Nothing made the pain worse.  He did have nausea and vomiting.  In the ED, he received tamsulosin, Zofran and Percocet.  His UA noted TNTC RBC's .  Serum creatinine 1.02.  WBC count 7.2.  CT Renal stone study performed on 08/26/2016 noted an obstructing 4 x 5 mm stone in the right mid ureter with mild proximal hydroureteronephrosis.  Fecalization of distal small bowel contents and moderate stool burden, suggesting slow transit/constipation.  I have independently reviewed the films.    He has not experiencing any symptoms.  He has not passed a fragment.  He has a strainer and has been straining.  His UA was unremarkable.  He does not have a prior history of stones.    KUB taken today noted right ureteral calculus seen 08/26/2016 is not visualized.  I have independently reviewed the films.  He is not having symptoms at this time.     PMH: Past Medical History:  Diagnosis Date  . Allergy   . Hyperlipidemia   . Hypertrophy, tongue, papillae     Surgical History: History reviewed. No pertinent surgical history.  Home Medications:  Allergies as of 09/06/2016   No Known Allergies     Medication List       Accurate as of 09/06/16  3:51 PM. Always use your most recent med list.          atorvastatin 40 MG tablet Commonly known as:  LIPITOR Take  1 tablet (40 mg total) by mouth daily.   fluticasone 50 MCG/ACT nasal spray Commonly known as:  FLONASE Place into the nose.   HYDROcodone-homatropine 5-1.5 MG/5ML syrup Commonly known as:  HYCODAN Take by mouth.   ibuprofen 800 MG tablet Commonly known as:  ADVIL,MOTRIN Take 1 tablet (800 mg total) by mouth every 8 (eight) hours as needed for moderate pain.   metaxalone 800 MG tablet Commonly known as:  SKELAXIN Take 1 tablet (800 mg total) by mouth 3 (three) times daily.   ondansetron 4 MG disintegrating tablet Commonly known as:  ZOFRAN ODT Take 1 tablet (4 mg total) by mouth every 8 (eight) hours as needed for nausea or vomiting.   oxyCODONE-acetaminophen 5-325 MG tablet Commonly known as:  ROXICET Take 1 tablet by mouth every 4 (four) hours as needed for severe pain.   tamsulosin 0.4 MG Caps capsule Commonly known as:  FLOMAX Take 1 capsule (0.4 mg total) by mouth daily.       Allergies: No Known Allergies  Family History: Family History  Problem Relation Age of Onset  . Arthritis Mother   . Hypertension Mother   . Heart attack Mother     hx of light heart attack  . Hyperlipidemia Mother   . Cancer Father     lung  .  Kidney disease Brother     had a transplant (hemodialysis)  . Prostate cancer Neg Hx     Social History:  reports that he has never smoked. His smokeless tobacco use includes Chew. He reports that he does not drink alcohol or use drugs.  ROS: UROLOGY Frequent Urination?: No Hard to postpone urination?: No Burning/pain with urination?: No Get up at night to urinate?: No Leakage of urine?: No Urine stream starts and stops?: No Trouble starting stream?: No Do you have to strain to urinate?: No Blood in urine?: No Urinary tract infection?: No Sexually transmitted disease?: No Injury to kidneys or bladder?: No Painful intercourse?: No Weak stream?: No Erection problems?: No Penile pain?: No  Gastrointestinal Nausea?: No Vomiting?:  No Indigestion/heartburn?: No Diarrhea?: No Constipation?: No  Constitutional Fever: No Night sweats?: No Weight loss?: No Fatigue?: No  Skin Skin rash/lesions?: No Itching?: No  Eyes Blurred vision?: No Double vision?: No  Ears/Nose/Throat Sore throat?: No Sinus problems?: No  Hematologic/Lymphatic Swollen glands?: No Easy bruising?: No  Cardiovascular Leg swelling?: No Chest pain?: No  Respiratory Cough?: No Shortness of breath?: No  Endocrine Excessive thirst?: No  Musculoskeletal Back pain?: No Joint pain?: No  Neurological Headaches?: No Dizziness?: No  Psychologic Depression?: No Anxiety?: No  Physical Exam: BP 122/80 (BP Location: Left Arm, Patient Position: Sitting, Cuff Size: Normal)   Pulse 79   Ht 5\' 9"  (1.753 m)   Wt 169 lb 8 oz (76.9 kg)   BMI 25.03 kg/m   Constitutional: Well nourished. Alert and oriented, No acute distress. HEENT: [place] AT, moist mucus membranes. Trachea midline, no masses. Cardiovascular: No clubbing, cyanosis, or edema. Respiratory: Normal respiratory effort, no increased work of breathing. GI: Abdomen is soft, non tender, non distended, no abdominal masses. Liver and spleen not palpable.  No hernias appreciated.  Stool sample for occult testing is not indicated.   GU: No CVA tenderness.  No bladder fullness or masses.   Skin: No rashes, bruises or suspicious lesions. Lymph: No cervical or inguinal adenopathy. Neurologic: Grossly intact, no focal deficits, moving all 4 extremities. Psychiatric: Normal mood and affect.  Laboratory Data: Lab Results  Component Value Date   WBC 7.2 08/26/2016   HGB 17.9 08/26/2016   HCT 51.4 08/26/2016   MCV 87.0 08/26/2016   PLT 323 08/26/2016    Lab Results  Component Value Date   CREATININE 1.02 08/26/2016       Component Value Date/Time   CHOL 180 05/26/2016 1032   CHOL 283 (H) 08/26/2015 0915   HDL 45 05/26/2016 1032   HDL 40 08/26/2015 0915   CHOLHDL 4.0  05/26/2016 1032   VLDL 14 05/26/2016 1032   LDLCALC 121 05/26/2016 1032   LDLCALC 215 (H) 08/26/2015 0915    Lab Results  Component Value Date   AST 23 05/26/2016   Lab Results  Component Value Date   ALT 23 05/26/2016      Pertinent Imaging: CLINICAL DATA:  Right ureteral stone  EXAM: ABDOMEN - 1 VIEW  COMPARISON:  08/26/2016 abdominal CT  FINDINGS: Right ureteral stone seen on the previous CT is not confidently identified. Sclerotic density overlapping the right sacral ala was also seen in 2010 and is osseous. There is a phlebolith in the right hemipelvis as confirmed by CT. Normal bowel gas pattern. No osseous findings.  IMPRESSION: Right ureteral calculus seen 08/26/2016 is not visualized today.   Electronically Signed   By: Marnee SpringJonathon  Watts M.D.   On: 09/06/2016 16:41  Assessment & Plan:    1. Right ureteral stone  - not visible on today's KUB  - Advised to contact our office or seek treatment in the ED if becomes febrile or pain/ vomiting are difficult control in order to arrange for emergent/urgent intervention  2. Right hydronephrosis  - obtain RUS to ensure the hydronephrosis has resolved   3. Gross hematuria  - resolved  Return for I will call patient with results.  These notes generated with voice recognition software. I apologize for typographical errors.  Michiel Cowboy, PA-C  [facility] [address], [address] [place], [place] [address] [phone]

## 2016-09-18 ENCOUNTER — Other Ambulatory Visit: Payer: Self-pay | Admitting: Family Medicine

## 2016-09-18 DIAGNOSIS — E785 Hyperlipidemia, unspecified: Secondary | ICD-10-CM

## 2016-09-18 DIAGNOSIS — E7800 Pure hypercholesterolemia, unspecified: Secondary | ICD-10-CM

## 2016-09-18 NOTE — Telephone Encounter (Signed)
Requesting refill on atorvastatin. Only have 3 pills left. Please send to [facility]

## 2016-09-20 MED ORDER — ATORVASTATIN CALCIUM 40 MG PO TABS: 40.0000 mg | ORAL_TABLET | Freq: Every day | ORAL | 0 refills | Status: DC

## 2016-09-20 NOTE — Telephone Encounter (Signed)
Patient notified

## 2016-09-28 ENCOUNTER — Ambulatory Visit: Payer: Managed Care, Other (non HMO)

## 2016-11-24 ENCOUNTER — Ambulatory Visit: Payer: 59 | Admitting: Family Medicine

## 2017-11-06 ENCOUNTER — Ambulatory Visit: Payer: Managed Care, Other (non HMO) | Admitting: Family Medicine

## 2017-11-26 ENCOUNTER — Encounter: Payer: Self-pay | Admitting: Family Medicine

## 2017-11-26 ENCOUNTER — Ambulatory Visit: Payer: BLUE CROSS/BLUE SHIELD | Admitting: Family Medicine

## 2017-11-26 VITALS — BP 114/72 | HR 81 | Temp 97.7°F | Resp 16 | Ht 71.0 in | Wt 180.7 lb

## 2017-11-26 DIAGNOSIS — E7800 Pure hypercholesterolemia, unspecified: Secondary | ICD-10-CM | POA: Diagnosis not present

## 2017-11-26 DIAGNOSIS — Z131 Encounter for screening for diabetes mellitus: Secondary | ICD-10-CM

## 2017-11-26 DIAGNOSIS — Z79899 Other long term (current) drug therapy: Secondary | ICD-10-CM

## 2017-11-26 DIAGNOSIS — J301 Allergic rhinitis due to pollen: Secondary | ICD-10-CM | POA: Diagnosis not present

## 2017-11-26 DIAGNOSIS — M62838 Other muscle spasm: Secondary | ICD-10-CM

## 2017-11-26 MED ORDER — OLOPATADINE HCL 0.2 % OP SOLN: 1.0000 [drp] | Freq: Every day | OPHTHALMIC | 2 refills | Status: DC

## 2017-11-26 MED ORDER — IBUPROFEN 800 MG PO TABS: 800.0000 mg | ORAL_TABLET | Freq: Three times a day (TID) | ORAL | 0 refills | Status: DC | PRN

## 2017-11-26 MED ORDER — FLUTICASONE PROPIONATE 50 MCG/ACT NA SUSP: 2.0000 | Freq: Every day | NASAL | 2 refills | Status: DC

## 2017-11-26 MED ORDER — LORATADINE 10 MG PO TABS: 10.0000 mg | ORAL_TABLET | Freq: Every day | ORAL | 11 refills | Status: DC

## 2017-11-26 NOTE — Progress Notes (Signed)
Name: Nicolas White   MRN: [id number]    DOB: Mar 05, 1976   Date:11/26/2017       Progress Note  Subjective  Chief Complaint  Chief Complaint  Patient presents with  . Medication Refill    6 month F/U  . Hyperlipidemia    Needs Refill  . Allergic Rhinitis     Has been taking Benadryl for relief-has symptoms of itchy eyes and headaches.     HPI  Hyperlipidemia: he stopped taking Atorvastatin over one year ago, he denies chest pain or palpitation   AR: he states symptoms worse over the past two weeks , itchy eyes, red, mild frontal headache, denies rhinorrhea , but has mild sneezing. He states not nasal congestion at this time, no wheezing or SOB. States he has to rub his eyes constantly and is tearing   Neck or lower back pain: intermittent , taking ibuprofen prn and denies side effects   Patient Active Problem List   Diagnosis Date Noted  . Hyperlipidemia 08/27/2015  . Displacement of cervical intervertebral disc without myelopathy 08/26/2015  . Calculus of kidney 08/26/2015  . Cervical radiculitis 08/26/2015  . Hypertrophy of tongue papillae 08/26/2015  . Allergic rhinitis, seasonal 10/22/2008    History reviewed. No pertinent surgical history.  Family History  Problem Relation Age of Onset  . Arthritis Mother   . Hypertension Mother   . Heart attack Mother        hx of light heart attack  . Hyperlipidemia Mother   . Cancer Father        lung  . Kidney disease Brother        had a transplant (hemodialysis)  . Stroke Maternal Grandmother   . Heart disease Maternal Grandmother   . Kidney disease Maternal Grandmother   . Prostate cancer Neg Hx     Social History   Socioeconomic History  . Marital status: Married    Spouse name: Not on file  . Number of children: Not on file  . Years of education: Not on file  . Highest education level: Not on file  Occupational History  . Not on file  Social Needs  . Financial resource strain: Not on file  . Food  insecurity:    Worry: Not on file    Inability: Not on file  . Transportation needs:    Medical: Not on file    Non-medical: Not on file  Tobacco Use  . Smoking status: Never Smoker  . Smokeless tobacco: Current User    Types: Chew  . Tobacco comment: patient has chewed tobacco for 25 years  Substance and Sexual Activity  . Alcohol use: No    Alcohol/week: 0.0 oz  . Drug use: No  . Sexual activity: Yes    Partners: Female  Lifestyle  . Physical activity:    Days per week: Not on file    Minutes per session: Not on file  . Stress: Not on file  Relationships  . Social connections:    Talks on phone: Not on file    Gets together: Not on file    Attends religious service: Not on file    Active member of club or organization: Not on file    Attends meetings of clubs or organizations: Not on file    Relationship status: Not on file  . Intimate partner violence:    Fear of current or ex partner: Not on file    Emotionally abused: Not on file  Physically abused: Not on file    Forced sexual activity: Not on file  Other Topics Concern  . Not on file  Social History Narrative  . Not on file     Current Outpatient Medications:  .  fluticasone (FLONASE) 50 MCG/ACT nasal spray, Place 2 sprays into both nostrils daily., Disp: 16 g, Rfl: 2 .  ibuprofen (ADVIL,MOTRIN) 800 MG tablet, Take 1 tablet (800 mg total) by mouth every 8 (eight) hours as needed for moderate pain., Disp: 30 tablet, Rfl: 0 .  loratadine (CLARITIN) 10 MG tablet, Take 1 tablet (10 mg total) by mouth daily., Disp: 30 tablet, Rfl: 11 .  Olopatadine HCl 0.2 % SOLN, Apply 1 drop to eye daily., Disp: 2.5 mL, Rfl: 2  No Known Allergies   ROS  Constitutional: Negative for fever or weight change.  Respiratory: Negative for cough and shortness of breath.   Cardiovascular: Negative for chest pain or palpitations.  Gastrointestinal: Negative for abdominal pain, no bowel changes.  Musculoskeletal: Negative for gait  problem or joint swelling.  Skin: Negative for rash.  Neurological: Negative for dizziness, positive mild headache.  No other specific complaints in a complete review of systems (except as listed in HPI above).   Objective  Vitals:   11/26/17 1522  BP: 114/72  Pulse: 81  Resp: 16  Temp: 97.7 F (36.5 C)  TempSrc: Oral  SpO2: 98%  Weight: 180 lb 11.2 oz (82 kg)  Height: 5\' 11"  (1.803 m)    Body mass index is 25.2 kg/m.  Physical Exam  Constitutional: Patient appears well-developed and well-nourished. No distress.  HEENT: head atraumatic, normocephalic, pupils equal and reactive to light, conjunctiva injected, neck supple, throat within normal limits, boggy turbinates Cardiovascular: Normal rate, regular rhythm and normal heart sounds.  No murmur heard. No BLE edema. Pulmonary/Chest: Effort normal and breath sounds normal. No respiratory distress. Abdominal: Soft.  There is no tenderness. Psychiatric: Patient has a normal mood and affect. behavior is normal. Judgment and thought content normal.  PHQ2/9: Depression screen [facility] 2/9 11/26/2017 05/26/2016 11/25/2015 08/26/2015  Decreased Interest 0 0 0 0  Down, Depressed, Hopeless 0 0 0 0  PHQ - 2 Score 0 0 0 0     Fall Risk: Fall Risk  11/26/2017 05/26/2016 11/25/2015 08/26/2015  Falls in the past year? No No No No     Functional Status Survey: Is the patient deaf or have difficulty hearing?: No Does the patient have difficulty seeing, even when wearing glasses/contacts?: No Does the patient have difficulty concentrating, remembering, or making decisions?: No Does the patient have difficulty walking or climbing stairs?: No Does the patient have difficulty dressing or bathing?: No Does the patient have difficulty doing errands alone such as visiting a doctor's office or shopping?: No   Assessment & Plan  1. Pure hypercholesterolemia  He stopped taking lipitor a long time ago - COMPLETE METABOLIC PANEL WITH GFR - Lipid  panel  2. Seasonal allergic rhinitis due to pollen  Resume medications - fluticasone (FLONASE) 50 MCG/ACT nasal spray; Place 2 sprays into both nostrils daily.  Dispense: 16 g; Refill: 2 - loratadine (CLARITIN) 10 MG tablet; Take 1 tablet (10 mg total) by mouth daily.  Dispense: 30 tablet; Refill: 11 - Olopatadine HCl 0.2 % SOLN; Apply 1 drop to eye daily.  Dispense: 2.5 mL; Refill: 2  3. Neck muscle spasm  Takes medication prn  - ibuprofen (ADVIL,MOTRIN) 800 MG tablet; Take 1 tablet (800 mg total) by mouth every 8 (  eight) hours as needed for moderate pain.  Dispense: 30 tablet; Refill: 0  4. Diabetes mellitus screening  - Hemoglobin A1c  5. Long-term use of high-risk medication  - CBC with Differential/Platelet

## 2017-11-27 LAB — CBC WITH DIFFERENTIAL/PLATELET
Basophils Absolute: 39 cells/uL (ref 0–200)
Basophils Relative: 0.7 %
Eosinophils Absolute: 88 cells/uL (ref 15–500)
Eosinophils Relative: 1.6 %
HCT: 51.9 % — ABNORMAL HIGH (ref 38.5–50.0)
Hemoglobin: 18.5 g/dL — ABNORMAL HIGH (ref 13.2–17.1)
Lymphs Abs: 2508 cells/uL (ref 850–3900)
MCH: 30.2 pg (ref 27.0–33.0)
MCHC: 35.6 g/dL (ref 32.0–36.0)
MCV: 84.7 fL (ref 80.0–100.0)
MPV: 9.6 fL (ref 7.5–12.5)
Monocytes Relative: 11.5 %
Neutro Abs: 2233 cells/uL (ref 1500–7800)
Neutrophils Relative %: 40.6 %
Platelets: 259 10*3/uL (ref 140–400)
RBC: 6.13 10*6/uL — ABNORMAL HIGH (ref 4.20–5.80)
RDW: 13.4 % (ref 11.0–15.0)
Total Lymphocyte: 45.6 %
WBC mixed population: 633 cells/uL (ref 200–950)
WBC: 5.5 10*3/uL (ref 3.8–10.8)

## 2017-11-27 LAB — LIPID PANEL
Cholesterol: 266 mg/dL — ABNORMAL HIGH (ref <200)
HDL: 36 mg/dL — ABNORMAL LOW (ref >40)
LDL Cholesterol (Calc): 188 mg/dL (calc) — ABNORMAL HIGH
Non-HDL Cholesterol (Calc): 230 mg/dL (calc) — ABNORMAL HIGH (ref <130)
Total CHOL/HDL Ratio: 7.4 (calc) — ABNORMAL HIGH (ref <5.0)
Triglycerides: 227 mg/dL — ABNORMAL HIGH (ref <150)

## 2017-11-27 LAB — COMPLETE METABOLIC PANEL WITH GFR
AG Ratio: 1.6 (calc) (ref 1.0–2.5)
ALT: 26 U/L (ref 9–46)
AST: 21 U/L (ref 10–40)
Albumin: 4.7 g/dL (ref 3.6–5.1)
Alkaline phosphatase (APISO): 87 U/L (ref 40–115)
BUN: 13 mg/dL (ref 7–25)
CO2: 30 mmol/L (ref 20–32)
Calcium: 9.7 mg/dL (ref 8.6–10.3)
Chloride: 101 mmol/L (ref 98–110)
Creat: 0.95 mg/dL (ref 0.60–1.35)
GFR, Est African American: 114 mL/min/{1.73_m2} (ref >=60)
GFR, Est Non African American: 98 mL/min/{1.73_m2} (ref >=60)
Globulin: 2.9 g/dL (calc) (ref 1.9–3.7)
Glucose, Bld: 90 mg/dL (ref 65–139)
Potassium: 4.1 mmol/L (ref 3.5–5.3)
Sodium: 138 mmol/L (ref 135–146)
Total Bilirubin: 0.5 mg/dL (ref 0.2–1.2)
Total Protein: 7.6 g/dL (ref 6.1–8.1)

## 2017-11-27 LAB — HEMOGLOBIN A1C
Hgb A1c MFr Bld: 5.8 % of total Hgb — ABNORMAL HIGH (ref <5.7)
Mean Plasma Glucose: 120 (calc)
eAG (mmol/L): 6.6 (calc)

## 2017-11-28 ENCOUNTER — Other Ambulatory Visit: Payer: Self-pay | Admitting: Family Medicine

## 2017-11-28 MED ORDER — ROSUVASTATIN CALCIUM 20 MG PO TABS: 20.0000 mg | ORAL_TABLET | Freq: Every day | ORAL | 0 refills | Status: DC

## 2017-12-03 ENCOUNTER — Other Ambulatory Visit: Payer: Self-pay | Admitting: Family Medicine

## 2017-12-03 DIAGNOSIS — R718 Other abnormality of red blood cells: Secondary | ICD-10-CM

## 2017-12-05 ENCOUNTER — Encounter: Payer: Self-pay | Admitting: Oncology

## 2017-12-05 ENCOUNTER — Other Ambulatory Visit: Payer: Self-pay

## 2017-12-05 ENCOUNTER — Inpatient Hospital Stay: Payer: BLUE CROSS/BLUE SHIELD

## 2017-12-05 ENCOUNTER — Inpatient Hospital Stay: Payer: BLUE CROSS/BLUE SHIELD | Attending: Oncology | Admitting: Oncology

## 2017-12-05 VITALS — BP 150/72 | HR 102 | Temp 97.9°F | Resp 18 | Ht 70.0 in | Wt 177.9 lb

## 2017-12-05 DIAGNOSIS — D751 Secondary polycythemia: Secondary | ICD-10-CM

## 2017-12-05 DIAGNOSIS — F17220 Nicotine dependence, chewing tobacco, uncomplicated: Secondary | ICD-10-CM

## 2017-12-05 DIAGNOSIS — E785 Hyperlipidemia, unspecified: Secondary | ICD-10-CM

## 2017-12-05 DIAGNOSIS — Z79899 Other long term (current) drug therapy: Secondary | ICD-10-CM

## 2017-12-05 LAB — CBC WITH DIFFERENTIAL/PLATELET
Basophils Absolute: 0 10*3/uL (ref 0–0.1)
Basophils Relative: 1 %
Eosinophils Absolute: 0 10*3/uL (ref 0–0.7)
Eosinophils Relative: 1 %
HCT: 54.1 % — ABNORMAL HIGH (ref 40.0–52.0)
Hemoglobin: 18.9 g/dL — ABNORMAL HIGH (ref 13.0–18.0)
Lymphocytes Relative: 21 %
Lymphs Abs: 1.6 10*3/uL (ref 1.0–3.6)
MCH: 30.9 pg (ref 26.0–34.0)
MCHC: 34.9 g/dL (ref 32.0–36.0)
MCV: 88.6 fL (ref 80.0–100.0)
Monocytes Absolute: 0.6 10*3/uL (ref 0.2–1.0)
Monocytes Relative: 7 %
Neutro Abs: 5.5 10*3/uL (ref 1.4–6.5)
Neutrophils Relative %: 70 %
Platelets: 250 10*3/uL (ref 150–440)
RBC: 6.11 MIL/uL — ABNORMAL HIGH (ref 4.40–5.90)
RDW: 13.2 % (ref 11.5–14.5)
WBC: 7.7 10*3/uL (ref 3.8–10.6)

## 2017-12-05 LAB — URINALYSIS, COMPLETE (UACMP) WITH MICROSCOPIC
Bilirubin Urine: NEGATIVE
Glucose, UA: NEGATIVE mg/dL
Ketones, ur: NEGATIVE mg/dL
Leukocytes, UA: NEGATIVE
Nitrite: NEGATIVE
Protein, ur: NEGATIVE mg/dL
Specific Gravity, Urine: 1.023 (ref 1.005–1.030)
Squamous Epithelial / LPF: NONE SEEN (ref 0–5)
pH: 5 (ref 5.0–8.0)

## 2017-12-05 LAB — TSH: TSH: 3.446 u[IU]/mL (ref 0.350–4.500)

## 2017-12-05 NOTE — Progress Notes (Signed)
Hematology/Oncology Consult note [facility] [phone] [phone]   Patient Care Team: Alba Cory, MD as PCP - General (Family Medicine)  REFERRING PROVIDER: Alba Cory, MD CHIEF COMPLAINTS/PURPOSE OF CONSULTATION:  Evaluation of red blood cell count.   HISTORY OF PRESENTING ILLNESS:  Nicolas White is a  42 y.o.  male with PMH listed below who was referred to me for evaluation of elevated red blood cell count.  Patient had lab work done on November 26, 2017.  Hemoglobin 18.5, HCT 51.9, normal white count with normal differential, normal platelet counts as to 59,000.  Reviewed patient's previous CBC.  He had one CBC done in January 2018 which showed hemoglobin 17.9 with hematocrit 51.4.  Prior than that another CBC done in January 2017 showed hematocrit 51.2.  Patient denies any history of stroke, heart attack, DVT.  He denies smoking cigarettes.  He chews tobacco Patient denies any use of testosterone supplements.  Denies any daytime sleepiness. He feels well at baseline.  Denies any fatigue, weight loss, fever or chills.   Review of Systems  Constitutional: Negative for chills, fever, malaise/fatigue and weight loss.  HENT: Negative for congestion, ear discharge, ear pain, nosebleeds, sinus pain and sore throat.   Eyes: Negative for double vision, photophobia, pain, discharge and redness.  Respiratory: Negative for cough, hemoptysis, sputum production, shortness of breath and wheezing.   Cardiovascular: Negative for chest pain, palpitations, orthopnea, claudication and leg swelling.  Gastrointestinal: Negative for abdominal pain, blood in stool, constipation, diarrhea, heartburn, melena, nausea and vomiting.  Genitourinary: Negative for dysuria, flank pain, frequency and hematuria.  Musculoskeletal: Negative for back pain, myalgias and neck pain.  Skin: Negative for itching and rash.  Neurological: Negative for dizziness,  tingling, tremors, focal weakness, weakness and headaches.  Endo/Heme/Allergies: Negative for environmental allergies. Does not bruise/bleed easily.  Psychiatric/Behavioral: Negative for depression, hallucinations, substance abuse and suicidal ideas. The patient is not nervous/anxious.     MEDICAL HISTORY:  Past Medical History:  Diagnosis Date  . Allergy   . Hyperlipidemia   . Hypertrophy, tongue, papillae     SURGICAL HISTORY: History reviewed. No pertinent surgical history.  SOCIAL HISTORY: Social History   Socioeconomic History  . Marital status: Married    Spouse name: Not on file  . Number of children: Not on file  . Years of education: Not on file  . Highest education level: Not on file  Occupational History  . Not on file  Social Needs  . Financial resource strain: Not on file  . Food insecurity:    Worry: Not on file    Inability: Not on file  . Transportation needs:    Medical: Not on file    Non-medical: Not on file  Tobacco Use  . Smoking status: Never Smoker  . Smokeless tobacco: Current User    Types: Chew  . Tobacco comment: patient has chewed tobacco for 25 years  Substance and Sexual Activity  . Alcohol use: No    Alcohol/week: 0.0 oz  . Drug use: No  . Sexual activity: Yes    Partners: Female  Lifestyle  . Physical activity:    Days per week: Not on file    Minutes per session: Not on file  . Stress: Not on file  Relationships  . Social connections:    Talks on phone: Not on file    Gets together: Not on file    Attends religious service: Not on file    Active member  of club or organization: Not on file    Attends meetings of clubs or organizations: Not on file    Relationship status: Not on file  . Intimate partner violence:    Fear of current or ex partner: Not on file    Emotionally abused: Not on file    Physically abused: Not on file    Forced sexual activity: Not on file  Other Topics Concern  . Not on file  Social History  Narrative  . Not on file    FAMILY HISTORY: Family History  Problem Relation Age of Onset  . Arthritis Mother   . Hypertension Mother   . Heart attack Mother        hx of light heart attack  . Hyperlipidemia Mother   . Cancer Father        lung  . Kidney disease Brother        had a transplant (hemodialysis)  . Stroke Maternal Grandmother   . Heart disease Maternal Grandmother   . Kidney disease Maternal Grandmother   . Prostate cancer Neg Hx     ALLERGIES:  has No Known Allergies.  MEDICATIONS:  Current Outpatient Medications  Medication Sig Dispense Refill  . fluticasone (FLONASE) 50 MCG/ACT nasal spray Place 2 sprays into both nostrils daily. 16 g 2  . ibuprofen (ADVIL,MOTRIN) 800 MG tablet Take 1 tablet (800 mg total) by mouth every 8 (eight) hours as needed for moderate pain. 30 tablet 0  . loratadine (CLARITIN) 10 MG tablet Take 1 tablet (10 mg total) by mouth daily. 30 tablet 11  . Olopatadine HCl 0.2 % SOLN Apply 1 drop to eye daily. 2.5 mL 2  . rosuvastatin (CRESTOR) 20 MG tablet Take 1 tablet (20 mg total) by mouth daily. 90 tablet 0   No current facility-administered medications for this visit.      PHYSICAL EXAMINATION: ECOG PERFORMANCE STATUS: 0 - Asymptomatic Vitals:   12/05/17 1445  BP: (!) 150/72  Pulse: (!) 102  Resp: 18  Temp: 97.9 F (36.6 C)  SpO2: 98%   Filed Weights   12/05/17 1445  Weight: 177 lb 14.4 oz (80.7 kg)    Physical Exam  Constitutional: He is oriented to person, place, and time. He appears well-developed and well-nourished. No distress.  HENT:  Head: Normocephalic and atraumatic.  Right Ear: External ear normal.  Left Ear: External ear normal.  Mouth/Throat: Oropharynx is clear and moist.  Eyes: Pupils are equal, round, and reactive to light. Conjunctivae and EOM are normal. No scleral icterus.  Neck: Normal range of motion. Neck supple.  Cardiovascular: Normal rate, regular rhythm and normal heart sounds.    Pulmonary/Chest: Effort normal and breath sounds normal. No respiratory distress. He has no wheezes. He has no rales. He exhibits no tenderness.  Abdominal: Soft. Bowel sounds are normal. He exhibits no distension and no mass. There is no tenderness.  Musculoskeletal: Normal range of motion. He exhibits no edema or deformity.  Lymphadenopathy:    He has no cervical adenopathy.  Neurological: He is alert and oriented to person, place, and time. No cranial nerve deficit. Coordination normal.  Skin: Skin is warm and dry. No rash noted.  Psychiatric: He has a normal mood and affect. His behavior is normal. Thought content normal.     LABORATORY DATA:  I have reviewed the data as listed Lab Results  Component Value Date   WBC 5.5 11/26/2017   HGB 18.5 (H) 11/26/2017   HCT 51.9 (H) 11/26/2017  MCV 84.7 11/26/2017   PLT 259 11/26/2017   Recent Labs    11/26/17 1615  NA 138  K 4.1  CL 101  CO2 30  GLUCOSE 90  BUN 13  CREATININE 0.95  CALCIUM 9.7  GFRNONAA 98  GFRAA 114  PROT 7.6  AST 21  ALT 26  BILITOT 0.5       ASSESSMENT & PLAN:  1. Erythrocytosis    Discussed with patient that erythrocytosis can be primary versus secondary to other process. Clinically he does not have obvious risk factors causing secondary erythrocytosis such as smoking cigarettes or testosterone shots. I will repeat a CBC with differential today, check erythropoietin level, UA, carbon monoxide level, TSH, Jak 2V617F with reflex to CALR/E12/MPL.   #Discussed with patient that chewing tobacco is not a healthy habit recommend to stop.  he voices understanding. All questions were answered. The patient knows to call the clinic with any problems questions or concerns.  Return of visit: 2 weeks to discuss lab results.  Thank you for this kind referral and the opportunity to participate in the care of this patient. A copy of today's note is routed to referring provider    Rickard Patience, MD, PhD Hematology  Oncology [facility] at [facility] Pager- [phone] 12/05/2017

## 2017-12-05 NOTE — Progress Notes (Signed)
Patient here for evaluation.

## 2017-12-06 LAB — ERYTHROPOIETIN: Erythropoietin: 7.9 m[IU]/mL (ref 2.6–18.5)

## 2017-12-06 LAB — CARBON MONOXIDE, BLOOD (PERFORMED AT REF LAB): Carbon Monoxide, Blood: 2.5 % (ref 0.0–3.6)

## 2017-12-13 LAB — JAK2 V617F, W REFLEX TO CALR/E12/MPL

## 2017-12-13 LAB — CALR + JAK2 E12-15 + MPL (REFLEXED)

## 2017-12-18 ENCOUNTER — Inpatient Hospital Stay (HOSPITAL_BASED_OUTPATIENT_CLINIC_OR_DEPARTMENT_OTHER): Payer: BLUE CROSS/BLUE SHIELD | Admitting: Oncology

## 2017-12-18 ENCOUNTER — Inpatient Hospital Stay: Payer: BLUE CROSS/BLUE SHIELD

## 2017-12-18 ENCOUNTER — Encounter: Payer: Self-pay | Admitting: Oncology

## 2017-12-18 VITALS — BP 132/84 | HR 90 | Temp 98.0°F | Resp 16 | Wt 178.4 lb

## 2017-12-18 DIAGNOSIS — D751 Secondary polycythemia: Secondary | ICD-10-CM | POA: Diagnosis not present

## 2017-12-18 DIAGNOSIS — F17220 Nicotine dependence, chewing tobacco, uncomplicated: Secondary | ICD-10-CM

## 2017-12-18 DIAGNOSIS — Z79899 Other long term (current) drug therapy: Secondary | ICD-10-CM | POA: Diagnosis not present

## 2017-12-18 DIAGNOSIS — E785 Hyperlipidemia, unspecified: Secondary | ICD-10-CM

## 2017-12-18 LAB — HEMOGLOBIN AND HEMATOCRIT, BLOOD
HCT: 54.1 % — ABNORMAL HIGH (ref 40.0–52.0)
Hemoglobin: 19 g/dL — ABNORMAL HIGH (ref 13.0–18.0)

## 2017-12-18 NOTE — Progress Notes (Signed)
Pt in for follow up and lab results from recent visit.  Denies any difficulties or concerns today.

## 2017-12-18 NOTE — Progress Notes (Signed)
Hematology/Oncology Follow up note [facility] [phone] [phone]   Patient Care Team: Alba Cory, MD as PCP - General (Family Medicine)  REFERRING PROVIDER: Alba Cory, MD CHIEF COMPLAINTS/PURPOSE OF CONSULTATION:  Evaluation of red blood cell count.   HISTORY OF PRESENTING ILLNESS:  EVERHETT White is a  42 y.o.  male with PMH listed below who was referred to me for evaluation of elevated red blood cell count.  Patient had lab work done on November 26, 2017.  Hemoglobin 18.5, HCT 51.9, normal white count with normal differential, normal platelet counts as to 59,000.  Reviewed patient's previous CBC.  He had one CBC done in January 2018 which showed hemoglobin 17.9 with hematocrit 51.4.  Prior than that another CBC done in January 2017 showed hematocrit 51.2.  Patient denies any history of stroke, heart attack, DVT.  He denies smoking cigarettes.  He chews tobacco Patient denies any use of testosterone supplements.  Denies any daytime sleepiness. He feels well at baseline.  Denies any fatigue, weight loss, fever or chills.  INTERVAL HISTORY Nicolas White is a 42 y.o. male who has above history reviewed by me today presents for follow up visit for management of erythrocytosis He presents to discuss lab results.  Denies any new complaints.    Review of Systems  Constitutional: Negative for chills, fever, malaise/fatigue and weight loss.  HENT: Negative for congestion, ear discharge, ear pain, nosebleeds, sinus pain and sore throat.   Eyes: Negative for double vision, photophobia, pain, discharge and redness.  Respiratory: Negative for cough, hemoptysis, sputum production, shortness of breath and wheezing.   Cardiovascular: Negative for chest pain, palpitations, orthopnea, claudication and leg swelling.  Gastrointestinal: Negative for abdominal pain, blood in stool, constipation, diarrhea, heartburn, melena, nausea and vomiting.    Genitourinary: Negative for dysuria, flank pain, frequency and hematuria.  Musculoskeletal: Negative for back pain, myalgias and neck pain.  Skin: Negative for itching and rash.  Neurological: Negative for dizziness, tingling, tremors, focal weakness, weakness and headaches.  Endo/Heme/Allergies: Negative for environmental allergies. Does not bruise/bleed easily.  Psychiatric/Behavioral: Negative for depression, hallucinations, substance abuse and suicidal ideas. The patient is not nervous/anxious.     MEDICAL HISTORY:  Past Medical History:  Diagnosis Date  . Allergy   . Hyperlipidemia   . Hypertrophy, tongue, papillae     SURGICAL HISTORY: History reviewed. No pertinent surgical history.  SOCIAL HISTORY: Social History   Socioeconomic History  . Marital status: Married    Spouse name: Not on file  . Number of children: Not on file  . Years of education: Not on file  . Highest education level: Not on file  Occupational History  . Not on file  Social Needs  . Financial resource strain: Not on file  . Food insecurity:    Worry: Not on file    Inability: Not on file  . Transportation needs:    Medical: Not on file    Non-medical: Not on file  Tobacco Use  . Smoking status: Never Smoker  . Smokeless tobacco: Current User    Types: Chew  . Tobacco comment: patient has chewed tobacco for 25 years  Substance and Sexual Activity  . Alcohol use: No    Alcohol/week: 0.0 oz  . Drug use: No  . Sexual activity: Yes    Partners: Female  Lifestyle  . Physical activity:    Days per week: Not on file    Minutes per session: Not on file  .  Stress: Not on file  Relationships  . Social connections:    Talks on phone: Not on file    Gets together: Not on file    Attends religious service: Not on file    Active member of club or organization: Not on file    Attends meetings of clubs or organizations: Not on file    Relationship status: Not on file  . Intimate partner  violence:    Fear of current or ex partner: Not on file    Emotionally abused: Not on file    Physically abused: Not on file    Forced sexual activity: Not on file  Other Topics Concern  . Not on file  Social History Narrative  . Not on file    FAMILY HISTORY: Family History  Problem Relation Age of Onset  . Arthritis Mother   . Hypertension Mother   . Heart attack Mother        hx of light heart attack  . Hyperlipidemia Mother   . Cancer Father        lung  . Kidney disease Brother        had a transplant (hemodialysis)  . Stroke Maternal Grandmother   . Heart disease Maternal Grandmother   . Kidney disease Maternal Grandmother   . Prostate cancer Neg Hx     ALLERGIES:  has No Known Allergies.  MEDICATIONS:  Current Outpatient Medications  Medication Sig Dispense Refill  . fluticasone (FLONASE) 50 MCG/ACT nasal spray Place 2 sprays into both nostrils daily. 16 g 2  . ibuprofen (ADVIL,MOTRIN) 800 MG tablet Take 1 tablet (800 mg total) by mouth every 8 (eight) hours as needed for moderate pain. 30 tablet 0  . loratadine (CLARITIN) 10 MG tablet Take 1 tablet (10 mg total) by mouth daily. 30 tablet 11  . Olopatadine HCl 0.2 % SOLN Apply 1 drop to eye daily. 2.5 mL 2  . rosuvastatin (CRESTOR) 20 MG tablet Take 1 tablet (20 mg total) by mouth daily. 90 tablet 0   No current facility-administered medications for this visit.      PHYSICAL EXAMINATION: ECOG PERFORMANCE STATUS: 0 - Asymptomatic Vitals:   12/18/17 1039  BP: 132/84  Pulse: 90  Resp: 16  Temp: 98 F (36.7 C)   Filed Weights   12/18/17 1039  Weight: 178 lb 7 oz (80.9 kg)    Physical Exam  Constitutional: He is oriented to person, place, and time. He appears well-developed and well-nourished. No distress.  HENT:  Head: Normocephalic and atraumatic.  Right Ear: External ear normal.  Left Ear: External ear normal.  Mouth/Throat: Oropharynx is clear and moist.  Eyes: Pupils are equal, round, and  reactive to light. Conjunctivae and EOM are normal. No scleral icterus.  Neck: Normal range of motion. Neck supple.  Cardiovascular: Normal rate, regular rhythm and normal heart sounds.  Pulmonary/Chest: Effort normal and breath sounds normal. No respiratory distress. He has no wheezes. He has no rales. He exhibits no tenderness.  Abdominal: Soft. Bowel sounds are normal. He exhibits no distension and no mass. There is no tenderness.  Musculoskeletal: Normal range of motion. He exhibits no edema or deformity.  Lymphadenopathy:    He has no cervical adenopathy.  Neurological: He is alert and oriented to person, place, and time. No cranial nerve deficit. Coordination normal.  Skin: Skin is warm and dry. No rash noted.  Psychiatric: He has a normal mood and affect. His behavior is normal. Thought content normal.  LABORATORY DATA:  I have reviewed the data as listed Lab Results  Component Value Date   WBC 7.7 12/05/2017   HGB 18.9 (H) 12/05/2017   HCT 54.1 (H) 12/05/2017   MCV 88.6 12/05/2017   PLT 250 12/05/2017   Recent Labs    11/26/17 1615  NA 138  K 4.1  CL 101  CO2 30  GLUCOSE 90  BUN 13  CREATININE 0.95  CALCIUM 9.7  GFRNONAA 98  GFRAA 114  PROT 7.6  AST 21  ALT 26  BILITOT 0.5       ASSESSMENT & PLAN:  1. Erythrocytosis    # he does not have obvious risk factors causing secondary erythrocytosis such as smoking cigarettes or testosterone shots. Jak2 V617F with reflex to CALR/E12/MPL is negative, UA negative, normal EPO level, normal TSH.  Will check BCR ABL.  His hemoglobin is rising, will arrange therapeutic phlebotomy x 1.  Refer to Pulmonology Dr.Ramachandra for sleep apnea study.  . All questions were answered. The patient knows to call the clinic with any problems questions or concerns.  Return of visit: 2 months.  Thank you for this kind referral and the opportunity to participate in the care of this patient. A copy of today's note is routed  to referring provider    Rickard Patience, MD, PhD Hematology Oncology [facility] at [facility] Pager- [phone] 12/18/2017

## 2017-12-24 ENCOUNTER — Inpatient Hospital Stay: Payer: BLUE CROSS/BLUE SHIELD

## 2017-12-29 LAB — BCR-ABL1 FISH
Cells Analyzed: 200
Cells Counted: 200

## 2018-02-04 ENCOUNTER — Telehealth: Payer: Self-pay | Admitting: Oncology

## 2018-02-04 NOTE — Telephone Encounter (Signed)
-----   Message from Collene LeydenAngela B Fuller sent at 12/25/2017  9:19 AM EDT ----- Regarding: R\S Phlebotomy Missed 12/24/17 phlebotomy appt.  Thank you

## 2018-02-04 NOTE — Telephone Encounter (Signed)
Have attempted to contact pt several times and no call back left messages about resch the phlebotomy he is coming on 7/29 for f/u another phlebotomy.

## 2018-03-04 ENCOUNTER — Inpatient Hospital Stay: Payer: BLUE CROSS/BLUE SHIELD

## 2018-03-04 ENCOUNTER — Inpatient Hospital Stay: Payer: BLUE CROSS/BLUE SHIELD | Admitting: Oncology

## 2018-03-05 ENCOUNTER — Encounter: Payer: Self-pay | Admitting: Oncology

## 2018-03-13 ENCOUNTER — Ambulatory Visit (INDEPENDENT_AMBULATORY_CARE_PROVIDER_SITE_OTHER): Payer: BLUE CROSS/BLUE SHIELD | Admitting: Nurse Practitioner

## 2018-03-13 ENCOUNTER — Encounter: Payer: Self-pay | Admitting: Nurse Practitioner

## 2018-03-13 VITALS — BP 110/90 | HR 90 | Temp 97.5°F | Resp 16 | Ht 70.0 in | Wt 176.0 lb

## 2018-03-13 DIAGNOSIS — R7303 Prediabetes: Secondary | ICD-10-CM

## 2018-03-13 DIAGNOSIS — H6122 Impacted cerumen, left ear: Secondary | ICD-10-CM

## 2018-03-13 DIAGNOSIS — Z5181 Encounter for therapeutic drug level monitoring: Secondary | ICD-10-CM

## 2018-03-13 DIAGNOSIS — M62838 Other muscle spasm: Secondary | ICD-10-CM

## 2018-03-13 DIAGNOSIS — M5412 Radiculopathy, cervical region: Secondary | ICD-10-CM

## 2018-03-13 DIAGNOSIS — R232 Flushing: Secondary | ICD-10-CM

## 2018-03-13 DIAGNOSIS — R030 Elevated blood-pressure reading, without diagnosis of hypertension: Secondary | ICD-10-CM

## 2018-03-13 DIAGNOSIS — Z0000 Encounter for general adult medical examination without abnormal findings: Secondary | ICD-10-CM | POA: Diagnosis not present

## 2018-03-13 DIAGNOSIS — E7800 Pure hypercholesterolemia, unspecified: Secondary | ICD-10-CM

## 2018-03-13 DIAGNOSIS — J3089 Other allergic rhinitis: Secondary | ICD-10-CM

## 2018-03-13 DIAGNOSIS — L74519 Primary focal hyperhidrosis, unspecified: Secondary | ICD-10-CM

## 2018-03-13 DIAGNOSIS — D751 Secondary polycythemia: Secondary | ICD-10-CM | POA: Diagnosis not present

## 2018-03-13 MED ORDER — ROSUVASTATIN CALCIUM 20 MG PO TABS: 20.0000 mg | ORAL_TABLET | Freq: Every day | ORAL | 0 refills | Status: DC

## 2018-03-13 NOTE — Patient Instructions (Addendum)
- drink at least 1 bottle of water or 2 glasses of water a day - add one fruit of vegetable a day  - Call the pulmonologist to set up time to do the sleep study - Get chest xray anytime at [facility] across the street.  -If you have not heard anything from my staff in a week about any orders/referrals/studies from today, please contact us here to follow-up [phone]    General Recomendations:  150 minutes of physical activity weekly, eat two servings of fish weekly, eat one serving of tree nuts ( cashews, pistachios, pecans, almonds.Marland Kitchen) every other day, eat 6 servings of fruit/vegetables daily and drink plenty of water and avoid sweet beverages.    DASH Eating Plan DASH stands for "Dietary Approaches to Stop Hypertension." The DASH eating plan is a healthy eating plan that has been shown to reduce high blood pressure (hypertension). It may also reduce your risk for type 2 diabetes, heart disease, and stroke. The DASH eating plan may also help with weight loss. What are tips for following this plan? General guidelines  Avoid eating more than 2,300 mg (milligrams) of salt (sodium) a day. If you have hypertension, you may need to reduce your sodium intake to 1,500 mg a day.  Limit alcohol intake to no more than 1 drink a day for nonpregnant women and 2 drinks a day for men. One drink equals 12 oz of beer, 5 oz of wine, or 1 oz of hard liquor.  Work with your health care provider to maintain a healthy body weight or to lose weight. Ask what an ideal weight is for you.  Get at least 30 minutes of exercise that causes your heart to beat faster (aerobic exercise) most days of the week. Activities may include walking, swimming, or biking.  Work with your health care provider or diet and nutrition specialist (dietitian) to adjust your eating plan to your individual calorie needs. Reading food labels  Check food labels for the amount of sodium per serving. Choose foods with less than 5  percent of the Daily Value of sodium. Generally, foods with less than 300 mg of sodium per serving fit into this eating plan.  To find whole grains, look for the word "whole" as the first word in the ingredient list. Shopping  Buy products labeled as "low-sodium" or "no salt added."  Buy fresh foods. Avoid canned foods and premade or frozen meals. Cooking  Avoid adding salt when cooking. Use salt-free seasonings or herbs instead of table salt or sea salt. Check with your health care provider or pharmacist before using salt substitutes.  Do not fry foods. Cook foods using healthy methods such as baking, boiling, grilling, and broiling instead.  Cook with heart-healthy oils, such as olive, canola, soybean, or sunflower oil. Meal planning   Eat a balanced diet that includes: ? 5 or more servings of fruits and vegetables each day. At each meal, try to fill half of your plate with fruits and vegetables. ? Up to 6-8 servings of whole grains each day. ? Less than 6 oz of lean meat, poultry, or fish each day. A 3-oz serving of meat is about the same size as a deck of cards. One egg equals 1 oz. ? 2 servings of low-fat dairy each day. ? A serving of nuts, seeds, or beans 5 times each week. ? Heart-healthy fats. Healthy fats called Omega-3 fatty acids are found in foods such as flaxseeds and coldwater fish, like sardines,  salmon, and mackerel.  Limit how much you eat of the following: ? Canned or prepackaged foods. ? Food that is high in trans fat, such as fried foods. ? Food that is high in saturated fat, such as fatty meat. ? Sweets, desserts, sugary drinks, and other foods with added sugar. ? Full-fat dairy products.  Do not salt foods before eating.  Try to eat at least 2 vegetarian meals each week.  Eat more home-cooked food and less restaurant, buffet, and fast food.  When eating at a restaurant, ask that your food be prepared with less salt or no salt, if possible. What foods are  recommended? The items listed may not be a complete list. Talk with your dietitian about what dietary choices are best for you. Grains Whole-grain or whole-wheat bread. Whole-grain or whole-wheat pasta. Brown rice. Orpah Cobbatmeal. Quinoa. Bulgur. Whole-grain and low-sodium cereals. Pita bread. Low-fat, low-sodium crackers. Whole-wheat flour tortillas. Vegetables Fresh or frozen vegetables (raw, steamed, roasted, or grilled). Low-sodium or reduced-sodium tomato and vegetable juice. Low-sodium or reduced-sodium tomato sauce and tomato paste. Low-sodium or reduced-sodium canned vegetables. Fruits All fresh, dried, or frozen fruit. Canned fruit in natural juice (without added sugar). Meat and other protein foods Skinless chicken or Malawiturkey. Ground chicken or Mal[place]urkey. Pork with fat trimmed off. Fish and seafood. Egg whites. Dried beans, peas, or lentils. Unsalted nuts, nut butters, and seeds. Unsalted canned beans. Lean cuts of beef with fat trimmed off. Low-sodium, lean deli meat. Dairy Low-fat (1%) or fat-free (skim) milk. Fat-free, low-fat, or reduced-fat cheeses. Nonfat, low-sodium ricotta or cottage cheese. Low-fat or nonfat yogurt. Low-fat, low-sodium cheese. Fats and oils Soft margarine without trans fats. Vegetable oil. Low-fat, reduced-fat, or light mayonnaise and salad dressings (reduced-sodium). Canola, safflower, olive, soybean, and sunflower oils. Avocado. Seasoning and other foods Herbs. Spices. Seasoning mixes without salt. Unsalted popcorn and pretzels. Fat-free sweets. What foods are not recommended? The items listed may not be a complete list. Talk with your dietitian about what dietary choices are best for you. Grains Baked goods made with fat, such as croissants, muffins, or some breads. Dry pasta or rice meal packs. Vegetables Creamed or fried vegetables. Vegetables in a cheese sauce. Regular canned vegetables (not low-sodium or reduced-sodium). Regular canned tomato sauce and paste (not  low-sodium or reduced-sodium). Regular tomato and vegetable juice (not low-sodium or reduced-sodium). Rosita FirePickles. Olives. Fruits Canned fruit in a light or heavy syrup. Fried fruit. Fruit in cream or butter sauce. Meat and other protein foods Fatty cuts of meat. Ribs. Fried meat. Tomasa BlaseBacon. Sausage. Bologna and other processed lunch meats. Salami. Fatback. Hotdogs. Bratwurst. Salted nuts and seeds. Canned beans with added salt. Canned or smoked fish. Whole eggs or egg yolks. Chicken or Malawiturkey with skin. Dairy Whole or 2% milk, cream, and half-and-half. Whole or full-fat cream cheese. Whole-fat or sweetened yogurt. Full-fat cheese. Nondairy creamers. Whipped toppings. Processed cheese and cheese spreads. Fats and oils Butter. Stick margarine. Lard. Shortening. Ghee. Bacon fat. Tropical oils, such as coconut, palm kernel, or palm oil. Seasoning and other foods Salted popcorn and pretzels. Onion salt, garlic salt, seasoned salt, table salt, and sea salt. Worcestershire sauce. Tartar sauce. Barbecue sauce. Teriyaki sauce. Soy sauce, including reduced-sodium. Steak sauce. Canned and packaged gravies. Fish sauce. Oyster sauce. Cocktail sauce. Horseradish that you find on the shelf. Ketchup. Mustard. Meat flavorings and tenderizers. Bouillon cubes. Hot sauce and Tabasco sauce. Premade or packaged marinades. Premade or packaged taco seasonings. Relishes. Regular salad dressings. Where to find more information:  [employer]  Heart, Lung, and Blood Institute: PopSteam.iswww.nhlbi.nih.gov  American Heart Association: www.heart.org Summary  The DASH eating plan is a healthy eating plan that has been shown to reduce high blood pressure (hypertension). It may also reduce your risk for type 2 diabetes, heart disease, and stroke.  With the DASH eating plan, you should limit salt (sodium) intake to 2,300 mg a day. If you have hypertension, you may need to reduce your sodium intake to 1,500 mg a day.  When on the DASH eating plan,  aim to eat more fresh fruits and vegetables, whole grains, lean proteins, low-fat dairy, and heart-healthy fats.  Work with your health care provider or diet and nutrition specialist (dietitian) to adjust your eating plan to your individual calorie needs. This information is not intended to replace advice given to you by your health care provider. Make sure you discuss any questions you have with your health care provider. Document Released: 07/13/2011 Document Revised: 07/17/2016 Document Reviewed: 07/17/2016 Elsevier Interactive Patient Education  [employer]2018 Elsevier Inc.

## 2018-03-13 NOTE — Progress Notes (Addendum)
Name: Nicolas White   MRN: [id number]    DOB: 1975-09-08   Date:03/13/2018       Progress Note  Subjective  Chief Complaint  Chief Complaint  Patient presents with  . Annual Exam  . hotflashes    has concerns about getting hot when he eats.    HPI  Patient presents for annual CPE.  Gustatory Sweating & hot flashes  Patient endorses getting hot flashes when eating started about three months ago- doesn't matter if food is spicy or not. States feels hot all over his body, some sweating throughout body; lasting about 20 minutes or so and goes away on his own. Denies nausea, tremors, lightheadedness or dizziness, excessive salivation. Has had shoulder pain that self-resolved months ago. Notes some postural lightheadedness- states just occasionally. Father had lung cancer- he did smoke; patient does not.    Cervical radiculitis States seems to have self-resolved- Takes ibuprofen as needed; cant remember the last time he took it   Allergic rhinitis Take Claritin as needed; seasonal   Erythrocytosis Saw oncology on 12/1417- recommended therapeutic phlebotomy- first appointment is on Monday and referral to pulm for sleep study.  States hasnt gotten back with pulmonologist to schedule   Hyperlipidemia Takes rosuvastatin 40m at night, with occasional missed doses.   USPSTF grade A and B recommendations:  Diet:  Eats a lot of junk food and fast food. First meal is around 1pm- bojangles- chicken supremes drinks sprite and sweet tea Next meal is around 5-6pm- eats steak or shrimp, mac and cheese, bread  Snacks on cookies, chips or crackers.  Drinks water occasionally Most weeks doesn't have any vegetables and maybe one fruit like a banana   Exercise:   Active at work- moving boxes in t[employer] ScheinDoes not exercise outside of work.   Depression:  Depression screen [facility]2/9 03/13/2018 11/26/2017 05/26/2016 11/25/2015 08/26/2015  Decreased Interest 0 0 0 0 0  Down, Depressed, Hopeless 0 0  0 0 0  PHQ - 2 Score 0 0 0 0 0    Hypertension:  BP Readings from Last 3 Encounters:  03/13/18 110/90  12/18/17 132/84  12/05/17 (!) 150/72    Obesity: Wt Readings from Last 3 Encounters:  03/13/18 176 lb (79.8 kg)  12/18/17 178 lb 7 oz (80.9 kg)  12/05/17 177 lb 14.4 oz (80.7 kg)   BMI Readings from Last 3 Encounters:  03/13/18 25.25 kg/m  12/18/17 25.60 kg/m  12/05/17 25.53 kg/m     Lipids:  Lab Results  Component Value Date   CHOL 266 (H) 11/26/2017   CHOL 180 05/26/2016   CHOL 283 (H) 08/26/2015   Lab Results  Component Value Date   HDL 36 (L) 11/26/2017   HDL 45 05/26/2016   HDL 40 08/26/2015   Lab Results  Component Value Date   LDLCALC 188 (H) 11/26/2017   LDLCALC 121 05/26/2016   LDLCALC 215 (H) 08/26/2015   Lab Results  Component Value Date   TRIG 227 (H) 11/26/2017   TRIG 72 05/26/2016   TRIG 138 08/26/2015   Lab Results  Component Value Date   CHOLHDL 7.4 (H) 11/26/2017   CHOLHDL 4.0 05/26/2016   CHOLHDL 7.1 (H) 08/26/2015   No results found for: LDLDIRECT Glucose:  Glucose, Bld  Date Value Ref Range Status  11/26/2017 90 65 - 139 mg/dL Final    Comment:    .        Non-fasting reference interval .   08/26/2016 130 (H)  65 - 99 mg/dL Final  05/26/2016 86 65 - 99 mg/dL Final      Office Visit from 03/13/2018 in [facility]  AUDIT-C Score  0      Married STD testing and prevention (HIV/chl/gon/syphilis): declines  Hep C: declines  Skin cancer: discussed prevention  Colorectal cancer: no personal or family history; no blood in stools.  Prostate cancer: n/a  No results found for: PSA  IPSS Questionnaire (AUA-7): Over the past month.   1)  How often have you had a sensation of not emptying your bladder completely after you finish urinating?  0 - Not at all  2)  How often have you had to urinate again less than two hours after you finished urinating? 0 - Not at all  3)  How often have you found you stopped  and started again several times when you urinated?  0 - Not at all  4) How difficult have you found it to postpone urination?  0 - Not at all  5) How often have you had a weak urinary stream?  0 - Not at all  6) How often have you had to push or strain to begin urination?  0 - Not at all  7) How many times did you most typically get up to urinate from the time you went to bed until the time you got up in the morning?  0 - None  Total score:  0-7 mildly symptomatic   8-19 moderately symptomatic   20-35 severely symptomatic   Lung cancer: Low Dose CT Chest recommended if Age 20-80 years, 30 pack-year currently smoking OR have quit w/in 15years. Patient does not qualify.   AAA: n/a  Advanced Care Planning: A voluntary discussion about advance care planning including the explanation and discussion of advance directives.  Discussed health care proxy and Living will, and the patient was able to identify a health care proxy as wife Tommy Goostree.  Patient does not have a living will at present time. If patient does have living will, I have requested they bring this to the clinic to be scanned in to their chart.  The 10-year ASCVD risk score Mikey Bussing DC Brooke Bonito., et al., 2013) is: 3.4%   Values used to calculate the score:     Age: 74 years     Sex: Male     Is Non-Hispanic African American: Yes     Diabetic: No     Tobacco smoker: No     Systolic Blood Pressure: 381 mmHg     Is BP treated: No     HDL Cholesterol: 36 mg/dL     Total Cholesterol: 266 mg/dL  Patient Active Problem List   Diagnosis Date Noted  . Erythrocytosis 12/18/2017  . Hyperlipidemia 08/27/2015  . Displacement of cervical intervertebral disc without myelopathy 08/26/2015  . Calculus of kidney 08/26/2015  . Cervical radiculitis 08/26/2015  . Hypertrophy of tongue papillae 08/26/2015  . Allergic rhinitis, seasonal 10/22/2008    History reviewed. No pertinent surgical history.  Family History  Problem Relation Age of Onset  .  Arthritis Mother   . Hypertension Mother   . Heart attack Mother        hx of light heart attack  . Hyperlipidemia Mother   . Cancer Father        lung  . Kidney disease Brother        had a transplant (hemodialysis)  . Stroke Maternal Grandmother   . Heart disease Maternal  Grandmother   . Kidney disease Maternal Grandmother   . Prostate cancer Neg Hx     Social History   Socioeconomic History  . Marital status: Married    Spouse name: Verdis Frederickson   . Number of children: 3  . Years of education: Not on file  . Highest education level: Some college, no degree  Occupational History  . Not on file  Social Needs  . Financial resource strain: Not on file  . Food insecurity:    Worry: Never true    Inability: Never true  . Transportation needs:    Medical: No    Non-medical: No  Tobacco Use  . Smoking status: Never Smoker  . Smokeless tobacco: Current User    Types: Chew  . Tobacco comment: patient has chewed tobacco for 25 years  Substance and Sexual Activity  . Alcohol use: No    Alcohol/week: 0.0 oz  . Drug use: No  . Sexual activity: Yes    Partners: Female  Lifestyle  . Physical activity:    Days per week: 0 days    Minutes per session: 0 min  . Stress: Not at all  Relationships  . Social connections:    Talks on phone: More than three times a week    Gets together: More than three times a week    Attends religious service: More than 4 times per year    Active member of club or organization: Not on file    Attends meetings of clubs or organizations: Not on file    Relationship status: Married  . Intimate partner violence:    Fear of current or ex partner: No    Emotionally abused: No    Physically abused: No    Forced sexual activity: No  Other Topics Concern  . Not on file  Social History Narrative  . Not on file     Current Outpatient Medications:  .  fluticasone (FLONASE) 50 MCG/ACT nasal spray, Place 2 sprays into both nostrils daily., Disp: 16 g,  Rfl: 2 .  ibuprofen (ADVIL,MOTRIN) 800 MG tablet, Take 1 tablet (800 mg total) by mouth every 8 (eight) hours as needed for moderate pain., Disp: 30 tablet, Rfl: 0 .  loratadine (CLARITIN) 10 MG tablet, Take 1 tablet (10 mg total) by mouth daily., Disp: 30 tablet, Rfl: 11 .  Olopatadine HCl 0.2 % SOLN, Apply 1 drop to eye daily., Disp: 2.5 mL, Rfl: 2 .  rosuvastatin (CRESTOR) 20 MG tablet, Take 1 tablet (20 mg total) by mouth daily., Disp: 90 tablet, Rfl: 0  No Known Allergies   Review of Systems  Constitutional: Negative for chills, fever and malaise/fatigue.  HENT: Negative for hearing loss, sinus pain and sore throat.   Eyes: Negative for blurred vision.  Respiratory: Negative for cough and shortness of breath.   Cardiovascular: Negative for chest pain, palpitations and leg swelling.  Gastrointestinal: Negative for abdominal pain, blood in stool, constipation, diarrhea, heartburn, nausea and vomiting.  Musculoskeletal: Positive for joint pain (sometimes knees get stiff but not that often- notices it with a lot of bending). Negative for myalgias.  Skin: Negative for rash.  Neurological: Negative for dizziness, weakness and headaches.  Endo/Heme/Allergies: Negative for polydipsia.  Psychiatric/Behavioral: Negative for depression. The patient is not nervous/anxious and does not have insomnia.     Objective  Vitals:   03/13/18 1347  BP: 110/90  Pulse: 90  Resp: 16  Temp: (!) 97.5 F (36.4 C)  TempSrc: Oral  SpO2: 98%  Weight: 176 lb (79.8 kg)  Height: _0  (1.778 m)    Body mass index is 25.25 kg/m.  Physical Exam  Constitutional: He appears well-developed and well-nourished.  HENT:  Head: Normocephalic and atraumatic.  Right Ear: External ear normal.  Left Ear: External ear normal.  Left ear initially impacted by cerumen- irrigated without issue   Eyes: Pupils are equal, round, and reactive to light. Conjunctivae are normal.  Neck: Normal range of motion. Neck  supple. Carotid bruit is not present.  Cardiovascular: Normal heart sounds and intact distal pulses.  Pulmonary/Chest: Effort normal and breath sounds normal. He exhibits no tenderness.  Abdominal: Soft. Bowel sounds are normal. He exhibits no mass. There is no tenderness. There is no CVA tenderness. No hernia.  Musculoskeletal: Normal range of motion. He exhibits no edema, tenderness or deformity.  Lymphadenopathy:    He has no cervical adenopathy.  Neurological: He is alert. He has normal strength. No cranial nerve deficit.  Reflex Scores:      Patellar reflexes are 2+ on the right side and 2+ on the left side. Skin: Skin is warm, dry and intact.  Psychiatric: He has a normal mood and affect. His speech is normal and behavior is normal. Judgment and thought content normal.  Vitals reviewed.   Fall Risk: Fall Risk  03/13/2018 11/26/2017 05/26/2016 11/25/2015 08/26/2015  Falls in the past year? _1       Functional Status Survey: Is the patient deaf or have difficulty hearing?: No Does the patient have difficulty seeing, even when wearing glasses/contacts?: No Does the patient have difficulty concentrating, remembering, or making decisions?: No Does the patient have difficulty walking or climbing stairs?: No Does the patient have difficulty dressing or bathing?: No Does the patient have difficulty doing errands alone such as visiting a doctor's office or shopping?: No    Assessment & Plan  1. Preventative health care Discussed diet and exercise at length; patient willing to add 2 glasses of water daily, add one fruit/ vegetable to diet daily and slowly increase from there. Discussed DASH guidelines will work on this and come back in one month for recheck of BP,   2. Pure hypercholesterolemia Discussed diet  - rosuvastatin (CRESTOR) 20 MG tablet; Take 1 tablet (20 mg total) by mouth daily.  Dispense: 90 tablet; Refill: 0 - Lipid Profile  3. Erythrocytosis Follow up with  onc on Monday.   4. Cervical radiculitis resolved  5. Seasonal allergic rhinitis due to other allergic trigger Seasonal; asymptomatic at this time   6. Neck muscle spasm resolved  7. Prediabetes Discussed diet at length  - HgB A1c  8. Medication monitoring encounter - COMPLETE METABOLIC PANEL WITH GFR  9. Impacted cerumen of left ear - Ear Lavage  10. Gustatory sweating Maybe related to diabetes, thyroid, r/o pancoast tumor,  -- HgB A1c - COMPLETE METABOLIC PANEL WITH GFR - DG Chest 2 View; Future - TSH  11. Hot flashes Maybe related to diabetes, thyroid, r/o pancoast tumor,  -- HgB A1c - COMPLETE METABOLIC PANEL WITH GFR - DG Chest 2 View; Future - TSH  12. Elevated blood pressure reading -discussed DASH; signs of stroke and heart attack, return in one month for follow-up on hypertension in one month.    -USPSTF grade A and B recommendations reviewed with patient; age-appropriate recommendations, preventive care, screening tests, etc discussed and encouraged; healthy living encouraged; see AVS for patient education given to patient -Discussed importance of 150 minutes of physical  activity weekly, eat two servings of fish weekly, eat one serving of tree nuts ( cashews, pistachios, pecans, almonds.Marland Kitchen) every other day, eat 6 servings of fruit/vegetables daily and drink plenty of water and avoid sweet beverages.   ---------------------------------------- I have reviewed this encounter including the documentation in this note and/or discussed this patient with the provider, Suezanne Cheshire DNP AGNP-C. I am certifying that I agree with the content of this note as supervising physician. Enid Derry, [facility] 03/13/2018, 5:08 PM

## 2018-03-14 LAB — COMPLETE METABOLIC PANEL WITH GFR
AG Ratio: 1.8 (calc) (ref 1.0–2.5)
ALT: 31 U/L (ref 9–46)
AST: 24 U/L (ref 10–40)
Albumin: 5.1 g/dL (ref 3.6–5.1)
Alkaline phosphatase (APISO): 86 U/L (ref 40–115)
BUN: 15 mg/dL (ref 7–25)
CO2: 26 mmol/L (ref 20–32)
Calcium: 9.7 mg/dL (ref 8.6–10.3)
Chloride: 102 mmol/L (ref 98–110)
Creat: 1.03 mg/dL (ref 0.60–1.35)
GFR, Est African American: 103 mL/min/{1.73_m2} (ref >=60)
GFR, Est Non African American: 89 mL/min/{1.73_m2} (ref >=60)
Globulin: 2.8 g/dL (calc) (ref 1.9–3.7)
Glucose, Bld: 95 mg/dL (ref 65–139)
Potassium: 3.7 mmol/L (ref 3.5–5.3)
Sodium: 140 mmol/L (ref 135–146)
Total Bilirubin: 0.5 mg/dL (ref 0.2–1.2)
Total Protein: 7.9 g/dL (ref 6.1–8.1)

## 2018-03-14 LAB — HEMOGLOBIN A1C
Hgb A1c MFr Bld: 5.7 % of total Hgb — ABNORMAL HIGH (ref <5.7)
Mean Plasma Glucose: 117 (calc)
eAG (mmol/L): 6.5 (calc)

## 2018-03-14 LAB — LIPID PANEL
Cholesterol: 181 mg/dL (ref <200)
HDL: 40 mg/dL — ABNORMAL LOW (ref >40)
LDL Cholesterol (Calc): 113 mg/dL (calc) — ABNORMAL HIGH
Non-HDL Cholesterol (Calc): 141 mg/dL (calc) — ABNORMAL HIGH (ref <130)
Total CHOL/HDL Ratio: 4.5 (calc) (ref <5.0)
Triglycerides: 162 mg/dL — ABNORMAL HIGH (ref <150)

## 2018-03-18 ENCOUNTER — Inpatient Hospital Stay: Payer: BLUE CROSS/BLUE SHIELD | Attending: Oncology

## 2018-03-18 ENCOUNTER — Inpatient Hospital Stay (HOSPITAL_BASED_OUTPATIENT_CLINIC_OR_DEPARTMENT_OTHER): Payer: BLUE CROSS/BLUE SHIELD | Admitting: Oncology

## 2018-03-18 ENCOUNTER — Encounter: Payer: Self-pay | Admitting: Oncology

## 2018-03-18 ENCOUNTER — Inpatient Hospital Stay: Payer: BLUE CROSS/BLUE SHIELD

## 2018-03-18 ENCOUNTER — Other Ambulatory Visit: Payer: Self-pay

## 2018-03-18 VITALS — BP 125/83 | HR 80 | Temp 96.1°F | Resp 18 | Wt 178.5 lb

## 2018-03-18 DIAGNOSIS — D751 Secondary polycythemia: Secondary | ICD-10-CM

## 2018-03-18 LAB — CBC WITH DIFFERENTIAL/PLATELET
Basophils Absolute: 0 10*3/uL (ref 0–0.1)
Basophils Relative: 1 %
Eosinophils Absolute: 0.1 10*3/uL (ref 0–0.7)
Eosinophils Relative: 1 %
HCT: 52.3 % — ABNORMAL HIGH (ref 40.0–52.0)
Hemoglobin: 18.2 g/dL — ABNORMAL HIGH (ref 13.0–18.0)
Lymphocytes Relative: 47 %
Lymphs Abs: 2.9 10*3/uL (ref 1.0–3.6)
MCH: 31.2 pg (ref 26.0–34.0)
MCHC: 34.7 g/dL (ref 32.0–36.0)
MCV: 89.9 fL (ref 80.0–100.0)
Monocytes Absolute: 0.7 10*3/uL (ref 0.2–1.0)
Monocytes Relative: 11 %
Neutro Abs: 2.6 10*3/uL (ref 1.4–6.5)
Neutrophils Relative %: 40 %
Platelets: 258 10*3/uL (ref 150–440)
RBC: 5.82 MIL/uL (ref 4.40–5.90)
RDW: 13.7 % (ref 11.5–14.5)
WBC: 6.3 10*3/uL (ref 3.8–10.6)

## 2018-03-18 NOTE — Progress Notes (Signed)
Patient here for follow up. Patient concerned because he is having hot flashes.

## 2018-03-18 NOTE — Progress Notes (Signed)
Hematology/Oncology Follow up note [facility] Telephone:([phone] Fax:([phone]   Patient Care Team: Alba CorySowles, Krichna, MD as PCP - General (Family Medicine)  REFERRING PROVIDER: Alba CorySowles, Krichna, MD CHIEF COMPLAINTS/PURPOSE OF CONSULTATION:  Evaluation of red blood cell count.   HISTORY OF PRESENTING ILLNESS:  Nicolas White is a  42 y.o.  male with PMH listed below who was referred to me for evaluation of elevated red blood cell count.  Patient had lab work done on November 26, 2017.  Hemoglobin 18.5, HCT 51.9, normal white count with normal differential, normal platelet counts as to 59,000.  Reviewed patient's previous CBC.  He had one CBC done in January 2018 which showed hemoglobin 17.9 with hematocrit 51.4.  Prior than that another CBC done in January 2017 showed hematocrit 51.2.  Patient denies any history of stroke, heart attack, DVT.  He denies smoking cigarettes.  He chews tobacco Patient denies any use of testosterone supplements.  Denies any daytime sleepiness. He feels well at baseline.  Denies any fatigue, weight loss, fever or chills.   #  not have obvious risk factors causing secondary erythrocytosis such as smoking cigarettes or testosterone shots Jak2 V617F with reflex to CALR/E12/MPL is negative, UA negative, normal EPO level, normal TSH.  BCR Abl negative   INTERVAL HISTORY Nicolas AlexanderKenneth D White is a 42 y.o. male who has above history reviewed by me today presents for follow-up visit for management of erythrocytosis. Reports feeling well.  Denies any headache, blurred vision, dizziness.  No recent hospitalizations or change of medications. He was previously referred to pulmonology and he has not established care. Review of Systems  Constitutional: Negative for chills, fever, malaise/fatigue and weight loss.  HENT: Negative for congestion, ear discharge, ear pain, nosebleeds, sinus pain and sore throat.   Eyes: Negative for double vision,  photophobia, pain, discharge and redness.  Respiratory: Negative for cough, hemoptysis, sputum production, shortness of breath and wheezing.   Cardiovascular: Negative for chest pain, palpitations, orthopnea, claudication and leg swelling.  Gastrointestinal: Negative for abdominal pain, blood in stool, constipation, diarrhea, heartburn, melena, nausea and vomiting.  Genitourinary: Negative for dysuria, flank pain, frequency and hematuria.  Musculoskeletal: Negative for back pain, myalgias and neck pain.  Skin: Negative for itching and rash.  Neurological: Negative for dizziness, tingling, tremors, focal weakness, weakness and headaches.  Endo/Heme/Allergies: Negative for environmental allergies. Does not bruise/bleed easily.  Psychiatric/Behavioral: Negative for depression, hallucinations, substance abuse and suicidal ideas. The patient is not nervous/anxious.     MEDICAL HISTORY:  Past Medical History:  Diagnosis Date  . Allergy   . Hyperlipidemia   . Hypertrophy, tongue, papillae     SURGICAL HISTORY: History reviewed. No pertinent surgical history.  SOCIAL HISTORY: Social History   Socioeconomic History  . Marital status: Married    Spouse name: Byrd HesselbachMaria   . Number of children: 3  . Years of education: Not on file  . Highest education level: Some college, no degree  Occupational History  . Not on file  Social Needs  . Financial resource strain: Not on file  . Food insecurity:    Worry: Never true    Inability: Never true  . Transportation needs:    Medical: No    Non-medical: No  Tobacco Use  . Smoking status: Never Smoker  . Smokeless tobacco: Current User    Types: Chew  . Tobacco comment: patient has chewed tobacco for 25 years  Substance and Sexual Activity  . Alcohol use: No  Alcohol/week: 0.0 standard drinks  . Drug use: No  . Sexual activity: Yes    Partners: Female  Lifestyle  . Physical activity:    Days per week: 0 days    Minutes per session: 0 min   . Stress: Not at all  Relationships  . Social connections:    Talks on phone: More than three times a week    Gets together: More than three times a week    Attends religious service: More than 4 times per year    Active member of club or organization: Not on file    Attends meetings of clubs or organizations: Not on file    Relationship status: Married  . Intimate partner violence:    Fear of current or ex partner: No    Emotionally abused: No    Physically abused: No    Forced sexual activity: No  Other Topics Concern  . Not on file  Social History Narrative  . Not on file    FAMILY HISTORY: Family History  Problem Relation Age of Onset  . Arthritis Mother   . Hypertension Mother   . Heart attack Mother        hx of light heart attack  . Hyperlipidemia Mother   . Cancer Father        lung  . Kidney disease Brother        had a transplant (hemodialysis)  . Stroke Maternal Grandmother   . Heart disease Maternal Grandmother   . Kidney disease Maternal Grandmother   . Prostate cancer Neg Hx     ALLERGIES:  has No Known Allergies.  MEDICATIONS:  Current Outpatient Medications  Medication Sig Dispense Refill  . fluticasone (FLONASE) 50 MCG/ACT nasal spray Place 2 sprays into both nostrils daily. 16 g 2  . ibuprofen (ADVIL,MOTRIN) 800 MG tablet Take 1 tablet (800 mg total) by mouth every 8 (eight) hours as needed for moderate pain. 30 tablet 0  . loratadine (CLARITIN) 10 MG tablet Take 1 tablet (10 mg total) by mouth daily. 30 tablet 11  . rosuvastatin (CRESTOR) 20 MG tablet Take 1 tablet (20 mg total) by mouth daily. 90 tablet 0  . Olopatadine HCl 0.2 % SOLN Apply 1 drop to eye daily. (Patient not taking: Reported on 03/18/2018) 2.5 mL 2   No current facility-administered medications for this visit.      PHYSICAL EXAMINATION: ECOG PERFORMANCE STATUS: 0 - Asymptomatic Vitals:   03/18/18 1405  BP: 125/83  Pulse: 80  Resp: 18  Temp: (!) 96.1 F (35.6 C)    Filed Weights   03/18/18 1405  Weight: 178 lb 8 oz (81 kg)    Physical Exam  Constitutional: He is oriented to person, place, and time. He appears well-developed and well-nourished. No distress.  HENT:  Head: Normocephalic and atraumatic.  Right Ear: External ear normal.  Left Ear: External ear normal.  Mouth/Throat: Oropharynx is clear and moist.  Eyes: Pupils are equal, round, and reactive to light. Conjunctivae and EOM are normal. No scleral icterus.  Neck: Normal range of motion. Neck supple.  Cardiovascular: Normal rate, regular rhythm and normal heart sounds.  Pulmonary/Chest: Effort normal and breath sounds normal. No respiratory distress. He has no wheezes. He has no rales. He exhibits no tenderness.  Abdominal: Soft. Bowel sounds are normal. He exhibits no distension and no mass. There is no tenderness.  Musculoskeletal: Normal range of motion. He exhibits no edema or deformity.  Lymphadenopathy:    He has  no cervical adenopathy.  Neurological: He is alert and oriented to person, place, and time. No cranial nerve deficit. Coordination normal.  Skin: Skin is warm and dry. No rash noted. No erythema.  Psychiatric: He has a normal mood and affect. His behavior is normal. Thought content normal.     LABORATORY DATA:  I have reviewed the data as listed Lab Results  Component Value Date   WBC 6.3 03/18/2018   HGB 18.2 (H) 03/18/2018   HCT 52.3 (H) 03/18/2018   MCV 89.9 03/18/2018   PLT 258 03/18/2018   Recent Labs    11/26/17 1615 03/13/18 1452  NA 138 140  K 4.1 3.7  CL 101 102  CO2 30 26  GLUCOSE 90 95  BUN 13 15  CREATININE 0.95 1.03  CALCIUM 9.7 9.7  GFRNONAA 98 89  GFRAA 114 103  PROT 7.6 7.9  AST 21 24  ALT 26 31  BILITOT 0.5 0.5      ASSESSMENT & PLAN:  1. Secondary erythrocytosis    #Labs reviewed and discussed with patient.  Less likely primary erythrocytosis.  Suspect underlying sleep apnea Encourage patient to establish care with Dr.  Linward Natalamachandra for sleep apnea study. For treatment of his erythrocytosis, will perform therapeutic phlebotomy 500 cc today, 500 cc in 2 weeks and 500 cc in 4 weeks.  Holding para meter hematocrit less than 45 hold phlebotomy..  . All questions were answered. The patient knows to call the clinic with any problems questions or concerns.  Return of visit: 2 months. Total face to face encounter time for this patient visit was 25 min. >50% of the time was  spent in counseling and coordination of care.   Rickard PatienceZhou Aleida Crandell, MD, PhD Hematology Oncology [facility] Pager- [phone] 03/18/2018

## 2018-04-01 ENCOUNTER — Inpatient Hospital Stay: Payer: BLUE CROSS/BLUE SHIELD

## 2018-04-01 ENCOUNTER — Other Ambulatory Visit: Payer: Self-pay | Admitting: Oncology

## 2018-04-01 VITALS — BP 116/82 | HR 78 | Resp 20

## 2018-04-01 DIAGNOSIS — D751 Secondary polycythemia: Secondary | ICD-10-CM

## 2018-04-01 LAB — HEMOGLOBIN AND HEMATOCRIT, BLOOD
HCT: 49.2 % (ref 40.0–52.0)
Hemoglobin: 17.3 g/dL (ref 13.0–18.0)

## 2018-04-02 ENCOUNTER — Encounter: Payer: Self-pay | Admitting: Internal Medicine

## 2018-04-02 ENCOUNTER — Ambulatory Visit
Admission: RE | Admit: 2018-04-02 | Discharge: 2018-04-02 | Disposition: A | Discharge status: Home / Self Care | Payer: BLUE CROSS/BLUE SHIELD | Source: Ambulatory Visit | Attending: Internal Medicine | Admitting: Internal Medicine

## 2018-04-02 ENCOUNTER — Ambulatory Visit (INDEPENDENT_AMBULATORY_CARE_PROVIDER_SITE_OTHER): Payer: BLUE CROSS/BLUE SHIELD | Admitting: Internal Medicine

## 2018-04-02 VITALS — BP 136/92 | HR 82 | Ht 70.0 in | Wt 179.0 lb

## 2018-04-02 DIAGNOSIS — M419 Scoliosis, unspecified: Secondary | ICD-10-CM | POA: Diagnosis not present

## 2018-04-02 DIAGNOSIS — R0602 Shortness of breath: Secondary | ICD-10-CM | POA: Diagnosis not present

## 2018-04-02 DIAGNOSIS — G4719 Other hypersomnia: Secondary | ICD-10-CM

## 2018-04-02 NOTE — Patient Instructions (Signed)
Obtain sleep study to assess for sleep apnea Check 2 view chest x-ray

## 2018-04-02 NOTE — Progress Notes (Signed)
Name: Nicolas White MRN: [id number] DOB: [birth year]     CONSULTATION DATE: 8.27.19 REFERRING MD : sowles  CHIEF COMPLAINT: excessive daytime sleepiness   STUDIES:     1.31.18 CXR independently reviewed by Me today No acute process No pneumonia   HISTORY OF PRESENT ILLNESS: 42 y.o.  male with evaluation of elevated red blood cell count was referred to [place] to evaluate for OSA  Patient had lab work done on November 26, 2017.  Hemoglobin 18.5, HCT 51.9, normal white count with normal differential, normal platelet counts as to 59,000.  Reviewed patient's previous CBC.  He had one CBC done in January 2018 which showed hemoglobin 17.9 with hematocrit 51.4.  Prior than that another CBC done in January 2017 showed hematocrit 51.2.  Patient denies any history of stroke, heart attack, DVT.  He denies smoking cigarettes.  He chews tobacco Patient denies any use of testosterone supplements.   He feels well at baseline. + fatigue,  Patient has been having excessive daytime sleepiness for many months Patient has been having extreme fatigue and tiredness, lack of energy +  very Loud snoring every night + struggling breathe at night and gasps for air  EPWORTH SLEEP SCORE 13  No signs  Of infection at this time No signs of CHF  PAST MEDICAL HISTORY :   has a past medical history of Allergy, Hyperlipidemia, and Hypertrophy, tongue, papillae.  has no past surgical history on file.     No Surgeries per patient  Prior to Admission medications   Medication Sig Start Date End Date Taking? Authorizing Provider  fluticasone (FLONASE) 50 MCG/ACT nasal spray Place 2 sprays into both nostrils daily. 11/26/17   Alba Cory, MD  ibuprofen (ADVIL,MOTRIN) 800 MG tablet Take 1 tablet (800 mg total) by mouth every 8 (eight) hours as needed for moderate pain. 11/26/17   Alba Cory, MD  loratadine (CLARITIN) 10 MG tablet Take 1 tablet (10 mg total) by mouth daily. 11/26/17   Alba Cory, MD    Olopatadine HCl 0.2 % SOLN Apply 1 drop to eye daily. Patient not taking: Reported on 03/18/2018 11/26/17   Alba Cory, MD  rosuvastatin (CRESTOR) 20 MG tablet Take 1 tablet (20 mg total) by mouth daily. 03/13/18   Poulose, Percell Belt, NP   No Known Allergies  FAMILY HISTORY:  family history includes Arthritis in his mother; Cancer in his father; Heart attack in his mother; Heart disease in his maternal grandmother; Hyperlipidemia in his mother; Hypertension in his mother; Kidney disease in his brother and maternal grandmother; Stroke in his maternal grandmother. SOCIAL HISTORY:  reports that he has never smoked. His smokeless tobacco use includes chew. He reports that he does not drink alcohol or use drugs.  REVIEW OF SYSTEMS:   Constitutional: + malaise/fatigue  HENT: Negative for hearing loss, ear pain, nosebleeds, congestion, sore throat, neck pain, tinnitus and ear discharge.   Eyes: Negative for blurred vision, double vision, photophobia, pain, discharge and redness.  Respiratory: Negative for cough, hemoptysis, sputum production, shortness of breath, wheezing and stridor.   Cardiovascular: Negative for chest pain, palpitations, orthopnea, claudication, leg swelling and PND.  Gastrointestinal: Negative for heartburn, nausea, vomiting, abdominal pain, diarrhea, constipation, blood in stool and melena.  Genitourinary: Negative for dysuria, urgency, frequency, hematuria and flank pain.  Musculoskeletal: Negative for myalgias, back pain, joint pain and falls.  Skin: Negative for itching and rash.  Neurological: Negative for dizziness, tingling, tremors, sensory change, speech change, focal weakness, seizures, loss of  consciousness, weakness and headaches.  Endo/Heme/Allergies: Negative for environmental allergies and polydipsia. Does not bruise/bleed easily.  ALL OTHER ROS ARE NEGATIVE   BP (!) 136/92 (BP Location: Left Arm, Cuff Size: Normal)   Pulse 82   Ht 5\' 10"  (1.778 m)   Wt  179 lb (81.2 kg)   SpO2 97%   BMI 25.68 kg/m   Physical Examination:   GENERAL:NAD, no fevers, chills, no weakness no fatigue HEAD: Normocephalic, atraumatic.  EYES: Pupils equal, round, reactive to light. Extraocular muscles intact. No scleral icterus.  MOUTH: Moist mucosal membrane.   EAR, NOSE, THROAT: Clear without exudates. No external lesions.  NECK: Supple. No thyromegaly. No nodules. No JVD.  PULMONARY:CTA B/L no wheezes, no crackles, no rhonchi CARDIOVASCULAR: S1 and S2. Regular rate and rhythm. No murmurs, rubs, or gallops. No edema.  GASTROINTESTINAL: Soft, nontender, nondistended. No masses. Positive bowel sounds.  MUSCULOSKELETAL: No swelling, clubbing, or edema. Range of motion full in all extremities.  NEUROLOGIC: Cranial nerves II through XII are intact. No gross focal neurological deficits.  SKIN: No ulceration, lesions, rashes, or cyanosis. Skin warm and dry. Turgor intact.  PSYCHIATRIC: Mood, affect within normal limits. The patient is awake, alert and oriented x 3. Insight, judgment intact.      ASSESSMENT / PLAN:  42 year old African-American male seen today for excessive daytime sleepiness and fatigue with snoring associated with elevated red blood cell count in the setting of possible underlying sleep apnea  At this time I would recommend obtaining a sleep study to assess for sleep apnea I would also recommend getting a two-view chest x-ray to assess for his fatigue   Follow-up after test completed   Patient  satisfied with Plan of action and management. All questions answered  Lucie LeatherKurian David Jimesha Rising, M.D.  [facility]  Medical Director [facility] Medical Director [facility]

## 2018-04-15 ENCOUNTER — Inpatient Hospital Stay: Payer: BLUE CROSS/BLUE SHIELD | Attending: Oncology

## 2018-04-15 ENCOUNTER — Other Ambulatory Visit: Payer: Self-pay | Admitting: *Deleted

## 2018-04-15 ENCOUNTER — Inpatient Hospital Stay: Payer: BLUE CROSS/BLUE SHIELD

## 2018-04-15 VITALS — BP 118/78 | HR 78 | Temp 97.8°F | Resp 20

## 2018-04-15 DIAGNOSIS — F17210 Nicotine dependence, cigarettes, uncomplicated: Secondary | ICD-10-CM | POA: Diagnosis not present

## 2018-04-15 DIAGNOSIS — D751 Secondary polycythemia: Secondary | ICD-10-CM

## 2018-04-15 LAB — HEMOGLOBIN: Hemoglobin: 16.2 g/dL (ref 13.0–18.0)

## 2018-04-15 LAB — HEMATOCRIT: HCT: 47.3 % (ref 40.0–52.0)

## 2018-05-10 ENCOUNTER — Other Ambulatory Visit: Payer: Self-pay | Admitting: *Deleted

## 2018-05-10 DIAGNOSIS — D751 Secondary polycythemia: Secondary | ICD-10-CM

## 2018-05-13 ENCOUNTER — Inpatient Hospital Stay: Payer: BLUE CROSS/BLUE SHIELD

## 2018-05-13 ENCOUNTER — Other Ambulatory Visit: Payer: Self-pay

## 2018-05-13 ENCOUNTER — Inpatient Hospital Stay: Payer: BLUE CROSS/BLUE SHIELD | Attending: Oncology | Admitting: Oncology

## 2018-05-13 ENCOUNTER — Encounter: Payer: Self-pay | Admitting: Oncology

## 2018-05-13 VITALS — BP 126/84 | HR 86 | Temp 97.1°F | Resp 18 | Wt 183.5 lb

## 2018-05-13 DIAGNOSIS — D751 Secondary polycythemia: Secondary | ICD-10-CM

## 2018-05-13 DIAGNOSIS — Z79899 Other long term (current) drug therapy: Secondary | ICD-10-CM | POA: Diagnosis not present

## 2018-05-13 DIAGNOSIS — F17220 Nicotine dependence, chewing tobacco, uncomplicated: Secondary | ICD-10-CM | POA: Diagnosis not present

## 2018-05-13 LAB — CBC WITH DIFFERENTIAL/PLATELET
Basophils Absolute: 0.1 10*3/uL (ref 0–0.1)
Basophils Relative: 1 %
Eosinophils Absolute: 0.1 10*3/uL (ref 0–0.7)
Eosinophils Relative: 1 %
HCT: 48.6 % (ref 40.0–52.0)
Hemoglobin: 16.8 g/dL (ref 13.0–18.0)
Lymphocytes Relative: 40 %
Lymphs Abs: 2.8 10*3/uL (ref 1.0–3.6)
MCH: 31.4 pg (ref 26.0–34.0)
MCHC: 34.6 g/dL (ref 32.0–36.0)
MCV: 90.9 fL (ref 80.0–100.0)
Monocytes Absolute: 0.8 10*3/uL (ref 0.2–1.0)
Monocytes Relative: 11 %
Neutro Abs: 3.2 10*3/uL (ref 1.4–6.5)
Neutrophils Relative %: 47 %
Platelets: 268 10*3/uL (ref 150–440)
RBC: 5.35 MIL/uL (ref 4.40–5.90)
RDW: 13.6 % (ref 11.5–14.5)
WBC: 6.9 10*3/uL (ref 3.8–10.6)

## 2018-05-13 NOTE — Progress Notes (Signed)
Hematology/Oncology Follow up note [facility] Telephone:(336) [phone] Fax:([phone]   Patient Care Team: Alba Cory, MD as PCP - General (Family Medicine)  REFERRING PROVIDER: Alba Cory, MD CHIEF COMPLAINTS/PURPOSE OF CONSULTATION:  Evaluation of red blood cell count.   HISTORY OF PRESENTING ILLNESS:  Nicolas White is a  42 y.o.  male with PMH listed below who was referred to me for evaluation of elevated red blood cell count.  Patient had lab work done on November 26, 2017.  Hemoglobin 18.5, HCT 51.9, normal white count with normal differential, normal platelet counts as to 59,000.  Reviewed patient's previous CBC.  He had one CBC done in January 2018 which showed hemoglobin 17.9 with hematocrit 51.4.  Prior than that another CBC done in January 2017 showed hematocrit 51.2.  Patient denies any history of stroke, heart attack, DVT.  He denies smoking cigarettes.  He chews tobacco Patient denies any use of testosterone supplements.  Denies any daytime sleepiness. He feels well at baseline.  Denies any fatigue, weight loss, fever or chills.   #  not have obvious risk factors causing secondary erythrocytosis such as smoking cigarettes or testosterone shots Jak2 V617F with reflex to CALR/E12/MPL is negative, UA negative, normal EPO level, normal TSH.  BCR Abl negative   INTERVAL HISTORY Nicolas White is a 42 y.o. male who has above history reviewed by me today presents for follow-up visit for erythrocytosis.  During the interval he has had theraputic phlebotomy.  Feels well at baseline.  Has established care with Dr.Kasa and was recommended for sleep study. He has not had it done yet because insurance denies an inpatient sleep study.  HE has a new born baby and it is challenging to do the test at home.   Review of Systems  Constitutional: Negative for chills, fever, malaise/fatigue and weight loss.  HENT: Negative for congestion, ear discharge,  ear pain, nosebleeds, sinus pain and sore throat.   Eyes: Negative for double vision, photophobia, pain, discharge and redness.  Respiratory: Negative for cough, hemoptysis, sputum production, shortness of breath and wheezing.   Cardiovascular: Negative for chest pain, palpitations, orthopnea, claudication and leg swelling.  Gastrointestinal: Negative for abdominal pain, blood in stool, constipation, diarrhea, heartburn, melena, nausea and vomiting.  Genitourinary: Negative for dysuria, flank pain, frequency and hematuria.  Musculoskeletal: Negative for back pain, myalgias and neck pain.  Skin: Negative for itching and rash.  Neurological: Negative for dizziness, tingling, tremors, focal weakness, weakness and headaches.  Endo/Heme/Allergies: Negative for environmental allergies. Does not bruise/bleed easily.  Psychiatric/Behavioral: Negative for depression, hallucinations, substance abuse and suicidal ideas. The patient is not nervous/anxious.     MEDICAL HISTORY:  Past Medical History:  Diagnosis Date  . Allergy   . Hyperlipidemia   . Hypertrophy, tongue, papillae     SURGICAL HISTORY: History reviewed. No pertinent surgical history.  SOCIAL HISTORY: Social History   Socioeconomic History  . Marital status: Married    Spouse name: Byrd Hesselbach   . Number of children: 3  . Years of education: Not on file  . Highest education level: Some college, no degree  Occupational History  . Not on file  Social Needs  . Financial resource strain: Not on file  . Food insecurity:    Worry: Never true    Inability: Never true  . Transportation needs:    Medical: No    Non-medical: No  Tobacco Use  . Smoking status: Never Smoker  . Smokeless tobacco: Current User  Types: Chew  . Tobacco comment: patient has chewed tobacco for 25 years  Substance and Sexual Activity  . Alcohol use: No    Alcohol/week: 0.0 standard drinks  . Drug use: No  . Sexual activity: Yes    Partners: Female    Lifestyle  . Physical activity:    Days per week: 0 days    Minutes per session: 0 min  . Stress: Not at all  Relationships  . Social connections:    Talks on phone: More than three times a week    Gets together: More than three times a week    Attends religious service: More than 4 times per year    Active member of club or organization: Not on file    Attends meetings of clubs or organizations: Not on file    Relationship status: Married  . Intimate partner violence:    Fear of current or ex partner: No    Emotionally abused: No    Physically abused: No    Forced sexual activity: No  Other Topics Concern  . Not on file  Social History Narrative  . Not on file    FAMILY HISTORY: Family History  Problem Relation Age of Onset  . Arthritis Mother   . Hypertension Mother   . Heart attack Mother        hx of light heart attack  . Hyperlipidemia Mother   . Cancer Father        lung  . Kidney disease Brother        had a transplant (hemodialysis)  . Stroke Maternal Grandmother   . Heart disease Maternal Grandmother   . Kidney disease Maternal Grandmother   . Prostate cancer Neg Hx     ALLERGIES:  has No Known Allergies.  MEDICATIONS:  Current Outpatient Medications  Medication Sig Dispense Refill  . fluticasone (FLONASE) 50 MCG/ACT nasal spray Place 2 sprays into both nostrils daily. 16 g 2  . ibuprofen (ADVIL,MOTRIN) 800 MG tablet Take 1 tablet (800 mg total) by mouth every 8 (eight) hours as needed for moderate pain. 30 tablet 0  . loratadine (CLARITIN) 10 MG tablet Take 1 tablet (10 mg total) by mouth daily. 30 tablet 11  . Olopatadine HCl 0.2 % SOLN Apply 1 drop to eye daily. 2.5 mL 2  . rosuvastatin (CRESTOR) 20 MG tablet Take 1 tablet (20 mg total) by mouth daily. 90 tablet 0   No current facility-administered medications for this visit.      PHYSICAL EXAMINATION: ECOG PERFORMANCE STATUS: 0 - Asymptomatic Vitals:   05/13/18 1404  BP: 126/84  Pulse: 86   Resp: 18  Temp: (!) 97.1 F (36.2 C)   Filed Weights   05/13/18 1404  Weight: 183 lb 8 oz (83.2 kg)    Physical Exam  Constitutional: He is oriented to person, place, and time. No distress.  HENT:  Head: Normocephalic and atraumatic.  Mouth/Throat: Oropharynx is clear and moist.  Eyes: Pupils are equal, round, and reactive to light. Conjunctivae and EOM are normal. No scleral icterus.  Neck: Normal range of motion. Neck supple.  Cardiovascular: Normal rate, regular rhythm and normal heart sounds.  Pulmonary/Chest: Effort normal and breath sounds normal. No respiratory distress. He has no wheezes. He has no rales. He exhibits no tenderness.  Abdominal: Soft. Bowel sounds are normal. He exhibits no distension and no mass. There is no tenderness.  Musculoskeletal: Normal range of motion. He exhibits no edema or deformity.  Lymphadenopathy:  He has no cervical adenopathy.  Neurological: He is alert and oriented to person, place, and time. No cranial nerve deficit. Coordination normal.  Skin: Skin is warm and dry. No rash noted. No erythema.  Psychiatric: He has a normal mood and affect. His behavior is normal. Thought content normal.     LABORATORY DATA:  I have reviewed the data as listed Lab Results  Component Value Date   WBC 6.9 05/13/2018   HGB 16.8 05/13/2018   HCT 48.6 05/13/2018   MCV 90.9 05/13/2018   PLT 268 05/13/2018   Recent Labs    11/26/17 1615 03/13/18 1452  NA 138 140  K 4.1 3.7  CL 101 102  CO2 30 26  GLUCOSE 90 95  BUN 13 15  CREATININE 0.95 1.03  CALCIUM 9.7 9.7  GFRNONAA 98 89  GFRAA 114 103  PROT 7.6 7.9  AST 21 24  ALT 26 31  BILITOT 0.5 0.5      ASSESSMENT & PLAN:  1. Erythrocytosis    # Secondary erthrocytosis, ? Due to underlying hypoxia, sleep apea.  Encourage patient to follow up with Dr.Kasa and arrange home sleep apnea study.  Labs are reviewed and discussed with patient. Hold phlebotomy.  Repeat cbc in 6 weeks. And  follow up in clinic in 12 weeks.  . All questions were answered. The patient knows to call the clinic with any problems questions or concerns.  Return of visit: 3 months.  Total face to face encounter time for this patient visit was 15 min. >50% of the time was  spent in counseling and coordination of care.  Rickard Patience, MD, PhD Hematology Oncology [facility] Pager- [phone] 05/13/2018

## 2018-05-13 NOTE — Progress Notes (Signed)
Patient here for follow up. No concerns voiced.  °

## 2018-06-04 DIAGNOSIS — Z23 Encounter for immunization: Secondary | ICD-10-CM | POA: Diagnosis not present

## 2018-06-18 ENCOUNTER — Ambulatory Visit (INDEPENDENT_AMBULATORY_CARE_PROVIDER_SITE_OTHER): Payer: BLUE CROSS/BLUE SHIELD | Admitting: Family Medicine

## 2018-06-18 ENCOUNTER — Encounter: Payer: Self-pay | Admitting: Family Medicine

## 2018-06-18 VITALS — BP 122/82 | HR 80 | Temp 98.1°F | Resp 18 | Ht 70.0 in | Wt 184.1 lb

## 2018-06-18 DIAGNOSIS — R7303 Prediabetes: Secondary | ICD-10-CM | POA: Diagnosis not present

## 2018-06-18 DIAGNOSIS — D751 Secondary polycythemia: Secondary | ICD-10-CM

## 2018-06-18 DIAGNOSIS — E7800 Pure hypercholesterolemia, unspecified: Secondary | ICD-10-CM

## 2018-06-18 DIAGNOSIS — J3089 Other allergic rhinitis: Secondary | ICD-10-CM

## 2018-06-18 DIAGNOSIS — H9202 Otalgia, left ear: Secondary | ICD-10-CM

## 2018-06-18 DIAGNOSIS — Z79899 Other long term (current) drug therapy: Secondary | ICD-10-CM

## 2018-06-18 MED ORDER — ROSUVASTATIN CALCIUM 20 MG PO TABS: 20.0000 mg | ORAL_TABLET | Freq: Every day | ORAL | 1 refills | Status: DC

## 2018-06-18 NOTE — Progress Notes (Addendum)
Name: RHYATT MUSKA   MRN: [id number]    DOB: [birth year]   Date:06/18/2018       Progress Note  Subjective  Chief Complaint  Chief Complaint  Patient presents with  . Follow-up    3 month F/U  . Pre-Diabetes  . Hyperlipidemia    States he feels hot at times-Needs refill    HPI    Hyperlipidemia: he had stopped taking atorvastatin and LDL went up, he is now taking crestor and last level improved, he denies myalgia, no chest pain or palpitation  Erythrocytosis: he is under the care of Dr. Cathie Hoops and was having monthly phlebotomy but last levels was good and it was not required. He is under seeing Dr. Belia Heman to see if erythrocytosis secondary to Sleep Apnea but insurance denied the study, he has a follow up, advised patient to contact insurance and see if he can have a home sleep study.   AR: he states symptoms worse during the spring, usually has  itchy and red eyes, sometimes rhinorrhea , but symptoms free at this time.  Neck or lower back pain: intermittent , taking ibuprofen prn and denies side effects, avoiding heavy lifting  Pre-diabetes: last hgbA1C was slightly elevated, discussed diet , he denies polyphagia, polydipsia or polyuria   Left ear pain: he states that for the past few days he has noticed aching pain on left jaw and ear when he eats. No sore throat or fever, he denies any jaw clicking. He has not been to the dentist in a long time and will schedule an appointment.  Patient Active Problem List   Diagnosis Date Noted  . Erythrocytosis 12/18/2017  . Hyperlipidemia 08/27/2015  . Displacement of cervical intervertebral disc without myelopathy 08/26/2015  . Calculus of kidney 08/26/2015  . Cervical radiculitis 08/26/2015  . Hypertrophy of tongue papillae 08/26/2015  . Allergic rhinitis, seasonal 10/22/2008    No past surgical history on file.  Family History  Problem Relation Age of Onset  . Arthritis Mother   . Hypertension Mother   . Heart attack Mother         hx of light heart attack  . Hyperlipidemia Mother   . Cancer Father        lung  . Kidney disease Brother        had a transplant (hemodialysis)  . Stroke Maternal Grandmother   . Heart disease Maternal Grandmother   . Kidney disease Maternal Grandmother   . Prostate cancer Neg Hx     Social History   Socioeconomic History  . Marital status: Married    Spouse name: Byrd Hesselbach   . Number of children: 3  . Years of education: Not on file  . Highest education level: Some college, no degree  Occupational History  . Not on file  Social Needs  . Financial resource strain: Not on file  . Food insecurity:    Worry: Never true    Inability: Never true  . Transportation needs:    Medical: No    Non-medical: No  Tobacco Use  . Smoking status: Never Smoker  . Smokeless tobacco: Current User    Types: Chew  . Tobacco comment: patient has chewed tobacco for 25 years  Substance and Sexual Activity  . Alcohol use: No    Alcohol/week: 0.0 standard drinks  . Drug use: No  . Sexual activity: Yes    Partners: Female  Lifestyle  . Physical activity:    Days per week: 0 days  Minutes per session: 0 min  . Stress: Not at all  Relationships  . Social connections:    Talks on phone: More than three times a week    Gets together: More than three times a week    Attends religious service: More than 4 times per year    Active member of club or organization: Not on file    Attends meetings of clubs or organizations: Not on file    Relationship status: Married  . Intimate partner violence:    Fear of current or ex partner: No    Emotionally abused: No    Physically abused: No    Forced sexual activity: No  Other Topics Concern  . Not on file  Social History Narrative  . Not on file     Current Outpatient Medications:  .  fluticasone (FLONASE) 50 MCG/ACT nasal spray, Place 2 sprays into both nostrils daily., Disp: 16 g, Rfl: 2 .  ibuprofen (ADVIL,MOTRIN) 800 MG tablet, Take 1 tablet  (800 mg total) by mouth every 8 (eight) hours as needed for moderate pain., Disp: 30 tablet, Rfl: 0 .  loratadine (CLARITIN) 10 MG tablet, Take 1 tablet (10 mg total) by mouth daily., Disp: 30 tablet, Rfl: 11 .  Olopatadine HCl 0.2 % SOLN, Apply 1 drop to eye daily., Disp: 2.5 mL, Rfl: 2 .  rosuvastatin (CRESTOR) 20 MG tablet, Take 1 tablet (20 mg total) by mouth daily., Disp: 90 tablet, Rfl: 0  No Known Allergies  I personally reviewed active problem list, medication list, allergies, family history, social history, health maintenance with the patient/caregiver today.   ROS  Constitutional: Negative for fever or weight change.  Respiratory: Negative for cough and shortness of breath.   Cardiovascular: Negative for chest pain or palpitations.  Gastrointestinal: Negative for abdominal pain, no bowel changes.  Musculoskeletal: Negative for gait problem or joint swelling.  Skin: Negative for rash.  Neurological: Negative for dizziness or headache.  No other specific complaints in a complete review of systems (except as listed in HPI above).  Objective  Vitals:   06/18/18 1532  BP: 122/82  Pulse: 80  Resp: 18  Temp: 98.1 F (36.7 C)  TempSrc: Oral  SpO2: 98%  Weight: 184 lb 1.6 oz (83.5 kg)  Height: 5\' 10"  (1.778 m)    Body mass index is 26.42 kg/m.  Physical Exam  Constitutional: Patient appears well-developed and well-nourished. Overweight. No distress.  HEENT: head atraumatic, normocephalic, pupils equal and reactive to light, neck supple, throat within normal limits Cardiovascular: Normal rate, regular rhythm and normal heart sounds.  No murmur heard. No BLE edema. Pulmonary/Chest: Effort normal and breath sounds normal. No respiratory distress. Abdominal: Soft.  There is no tenderness. Psychiatric: Patient has a normal mood and affect. behavior is normal. Judgment and thought content normal.  Recent Results (from the past 2160 hour(s))  Hemoglobin and Hematocrit,  Blood     Status: None   Collection Time: 04/01/18  2:36 PM  Result Value Ref Range   Hemoglobin 17.3 13.0 - 18.0 g/dL   HCT 16.1 09.6 - 04.5 %    Comment: Performed at [facility], [address] [place], [place] [address]  Hematocrit     Status: None   Collection Time: 04/15/18  2:42 PM  Result Value Ref Range   HCT 47.3 40.0 - 52.0 %    Comment: Performed at [facility], [address], [place], [place] [address]  Hemoglobin     Status:  None   Collection Time: 04/15/18  2:42 PM  Result Value Ref Range   Hemoglobin 16.2 13.0 - 18.0 g/dL    Comment: Performed at [facility], [address] [place], [place] [address]  CBC with Differential/Platelet     Status: None   Collection Time: 05/13/18  1:53 PM  Result Value Ref Range   WBC 6.9 3.8 - 10.6 K/uL   RBC 5.35 4.40 - 5.90 MIL/uL   Hemoglobin 16.8 13.0 - 18.0 g/dL   HCT 62.148.6 30.840.0 - 65.752.0 %   MCV 90.9 80.0 - 100.0 fL   MCH 31.4 26.0 - 34.0 pg   MCHC 34.6 32.0 - 36.0 g/dL   RDW 84.613.6 96.211.5 - 95.214.5 %   Platelets 268 150 - 440 K/uL   Neutrophils Relative % 47 %   Neutro Abs 3.2 1.4 - 6.5 K/uL   Lymphocytes Relative 40 %   Lymphs Abs 2.8 1.0 - 3.6 K/uL   Monocytes Relative 11 %   Monocytes Absolute 0.8 0.2 - 1.0 K/uL   Eosinophils Relative 1 %   Eosinophils Absolute 0.1 0 - 0.7 K/uL   Basophils Relative 1 %   Basophils Absolute 0.1 0 - 0.1 K/uL    Comment: Performed at [facility], [address] [place], [place] [address]     PHQ2/9: Depression screen [facility] 03/13/2018 11/26/2017 05/26/2016 11/25/2015 08/26/2015  Decreased Interest 0 0 0 0 0  Down, Depressed, Hopeless 0 0 0 0 0  PHQ - 2 Score 0 0 0 0 0     Fall Risk: Fall Risk  06/18/2018 03/13/2018 11/26/2017 05/26/2016 11/25/2015  Falls in the past year? 0 No No No No  Number falls in past yr: 0 - - - -  Injury with Fall? 0 - - - -    Functional Status Survey: Is the patient deaf or have difficulty hearing?: No Does the patient have  difficulty seeing, even when wearing glasses/contacts?: No Does the patient have difficulty concentrating, remembering, or making decisions?: No Does the patient have difficulty walking or climbing stairs?: No Does the patient have difficulty dressing or bathing?: No Does the patient have difficulty doing errands alone such as visiting a doctor's office or shopping?: No   Assessment & Plan  1. Pure hypercholesterolemia  - rosuvastatin (CRESTOR) 20 MG tablet; Take 1 tablet (20 mg total) by mouth daily.  Dispense: 90 tablet; Refill: 1 - lipid panel   2. Erythrocytosis  Having phlebotomy done by hematologist, trying to get PA for sleep study - requested by Dr. Belia HemanKasa to rule out secondary erythrocytosis   3. Seasonal allergic rhinitis due to other allergic trigger  Continue medication prn   4. Prediabetes  Recheck hgbA1C   5. Long-term use of high-risk medication  - Hemoglobin A1c -comp panel  6. Left ear pain  Discussed possible TMJ

## 2018-06-19 LAB — COMPLETE METABOLIC PANEL WITH GFR
AG Ratio: 1.7 (calc) (ref 1.0–2.5)
ALT: 29 U/L (ref 9–46)
AST: 23 U/L (ref 10–40)
Albumin: 4.8 g/dL (ref 3.6–5.1)
Alkaline phosphatase (APISO): 81 U/L (ref 40–115)
BUN: 19 mg/dL (ref 7–25)
CO2: 29 mmol/L (ref 20–32)
Calcium: 9.8 mg/dL (ref 8.6–10.3)
Chloride: 103 mmol/L (ref 98–110)
Creat: 1.06 mg/dL (ref 0.60–1.35)
GFR, Est African American: 99 mL/min/{1.73_m2} (ref >=60)
GFR, Est Non African American: 86 mL/min/{1.73_m2} (ref >=60)
Globulin: 2.9 g/dL (calc) (ref 1.9–3.7)
Glucose, Bld: 96 mg/dL (ref 65–139)
Potassium: 3.9 mmol/L (ref 3.5–5.3)
Sodium: 139 mmol/L (ref 135–146)
Total Bilirubin: 0.4 mg/dL (ref 0.2–1.2)
Total Protein: 7.7 g/dL (ref 6.1–8.1)

## 2018-06-19 LAB — HEMOGLOBIN A1C
Hgb A1c MFr Bld: 5.7 % of total Hgb — ABNORMAL HIGH (ref <5.7)
Mean Plasma Glucose: 117 (calc)
eAG (mmol/L): 6.5 (calc)

## 2018-06-19 LAB — LIPID PANEL
Cholesterol: 138 mg/dL (ref <200)
HDL: 34 mg/dL — ABNORMAL LOW (ref >40)
LDL Cholesterol (Calc): 74 mg/dL (calc)
Non-HDL Cholesterol (Calc): 104 mg/dL (calc) (ref <130)
Total CHOL/HDL Ratio: 4.1 (calc) (ref <5.0)
Triglycerides: 201 mg/dL — ABNORMAL HIGH (ref <150)

## 2018-06-24 ENCOUNTER — Inpatient Hospital Stay: Payer: BLUE CROSS/BLUE SHIELD | Attending: Oncology

## 2018-06-24 DIAGNOSIS — F17210 Nicotine dependence, cigarettes, uncomplicated: Secondary | ICD-10-CM | POA: Insufficient documentation

## 2018-06-24 DIAGNOSIS — D751 Secondary polycythemia: Secondary | ICD-10-CM | POA: Diagnosis not present

## 2018-06-24 LAB — CBC WITH DIFFERENTIAL/PLATELET
Abs Immature Granulocytes: 0.02 10*3/uL (ref 0.00–0.07)
Basophils Absolute: 0 10*3/uL (ref 0.0–0.1)
Basophils Relative: 1 %
Eosinophils Absolute: 0 10*3/uL (ref 0.0–0.5)
Eosinophils Relative: 1 %
HCT: 51.3 % (ref 39.0–52.0)
Hemoglobin: 17.6 g/dL — ABNORMAL HIGH (ref 13.0–17.0)
Immature Granulocytes: 0 %
Lymphocytes Relative: 30 %
Lymphs Abs: 2.2 10*3/uL (ref 0.7–4.0)
MCH: 29.8 pg (ref 26.0–34.0)
MCHC: 34.3 g/dL (ref 30.0–36.0)
MCV: 86.9 fL (ref 80.0–100.0)
Monocytes Absolute: 0.6 10*3/uL (ref 0.1–1.0)
Monocytes Relative: 9 %
Neutro Abs: 4.4 10*3/uL (ref 1.7–7.7)
Neutrophils Relative %: 59 %
Platelets: 245 10*3/uL (ref 150–400)
RBC: 5.9 MIL/uL — ABNORMAL HIGH (ref 4.22–5.81)
RDW: 11.8 % (ref 11.5–15.5)
WBC: 7.3 10*3/uL (ref 4.0–10.5)
nRBC: 0 % (ref 0.0–0.2)

## 2018-08-14 ENCOUNTER — Inpatient Hospital Stay: Payer: BLUE CROSS/BLUE SHIELD

## 2018-08-14 ENCOUNTER — Inpatient Hospital Stay (HOSPITAL_BASED_OUTPATIENT_CLINIC_OR_DEPARTMENT_OTHER): Payer: BLUE CROSS/BLUE SHIELD | Admitting: Oncology

## 2018-08-14 ENCOUNTER — Inpatient Hospital Stay: Payer: BLUE CROSS/BLUE SHIELD | Attending: Oncology

## 2018-08-14 ENCOUNTER — Other Ambulatory Visit: Payer: Self-pay

## 2018-08-14 ENCOUNTER — Encounter: Payer: Self-pay | Admitting: Oncology

## 2018-08-14 VITALS — BP 128/72 | HR 71 | Temp 98.4°F

## 2018-08-14 DIAGNOSIS — D751 Secondary polycythemia: Secondary | ICD-10-CM | POA: Diagnosis not present

## 2018-08-14 DIAGNOSIS — F17220 Nicotine dependence, chewing tobacco, uncomplicated: Secondary | ICD-10-CM

## 2018-08-14 DIAGNOSIS — Z79899 Other long term (current) drug therapy: Secondary | ICD-10-CM

## 2018-08-14 DIAGNOSIS — E785 Hyperlipidemia, unspecified: Secondary | ICD-10-CM

## 2018-08-14 DIAGNOSIS — R5381 Other malaise: Secondary | ICD-10-CM | POA: Diagnosis not present

## 2018-08-14 DIAGNOSIS — Z801 Family history of malignant neoplasm of trachea, bronchus and lung: Secondary | ICD-10-CM

## 2018-08-14 DIAGNOSIS — Z791 Long term (current) use of non-steroidal anti-inflammatories (NSAID): Secondary | ICD-10-CM

## 2018-08-14 DIAGNOSIS — R5383 Other fatigue: Secondary | ICD-10-CM | POA: Diagnosis not present

## 2018-08-14 LAB — CBC WITH DIFFERENTIAL/PLATELET
Abs Immature Granulocytes: 0.01 10*3/uL (ref 0.00–0.07)
Basophils Absolute: 0 10*3/uL (ref 0.0–0.1)
Basophils Relative: 1 %
Eosinophils Absolute: 0.1 10*3/uL (ref 0.0–0.5)
Eosinophils Relative: 1 %
HCT: 51.8 % (ref 39.0–52.0)
Hemoglobin: 18 g/dL — ABNORMAL HIGH (ref 13.0–17.0)
Immature Granulocytes: 0 %
Lymphocytes Relative: 41 %
Lymphs Abs: 2.6 10*3/uL (ref 0.7–4.0)
MCH: 29.5 pg (ref 26.0–34.0)
MCHC: 34.7 g/dL (ref 30.0–36.0)
MCV: 84.8 fL (ref 80.0–100.0)
Monocytes Absolute: 0.7 10*3/uL (ref 0.1–1.0)
Monocytes Relative: 11 %
Neutro Abs: 2.9 10*3/uL (ref 1.7–7.7)
Neutrophils Relative %: 46 %
Platelets: 252 10*3/uL (ref 150–400)
RBC: 6.11 MIL/uL — ABNORMAL HIGH (ref 4.22–5.81)
RDW: 12.1 % (ref 11.5–15.5)
WBC: 6.3 10*3/uL (ref 4.0–10.5)
nRBC: 0 % (ref 0.0–0.2)

## 2018-08-14 NOTE — Progress Notes (Signed)
Hematology/Oncology Follow up note [facility] [phone]) [phone] Fax:(336) [phone]   Patient Care Team: Alba CorySowles, Krichna, MD as PCP - General (Family Medicine)  REFERRING PROVIDER: Alba CorySowles, Krichna, MD CHIEF COMPLAINTS/PURPOSE OF CONSULTATION:  Evaluation of red blood cell count.   HISTORY OF PRESENTING ILLNESS:  Nicolas White is a  43 y.o.  male with PMH listed below who was referred to me for evaluation of elevated red blood cell count.  Patient had lab work done on November 26, 2017.  Hemoglobin 18.5, HCT 51.9, normal white count with normal differential, normal platelet counts as to 59,000.  Reviewed patient's previous CBC.  He had one CBC done in January 2018 which showed hemoglobin 17.9 with hematocrit 51.4.  Prior than that another CBC done in January 2017 showed hematocrit 51.2.  Patient denies any history of stroke, heart attack, DVT.  He denies smoking cigarettes.  He chews tobacco Patient denies any use of testosterone supplements.  Denies any daytime sleepiness. He feels well at baseline.  Denies any fatigue, weight loss, fever or chills.   #  not have obvious risk factors causing secondary erythrocytosis such as smoking cigarettes or testosterone shots Jak2 [id number] with reflex to CALR/E12/MPL is negative, UA negative, normal EPO level, normal TSH.  BCR Abl negative   INTERVAL HISTORY Nicolas AlexanderKenneth D Longoria is a 43 y.o. male who has above history reviewed by me today presents for follow-up visit for secondary erythrocytosis.  During interval, patient has establish care with pulmonology Dr.Kasa.  Patient has had chest x-ray done on 04/02/2018 which showed possible generalized airway thickening.  Mild scoliosis.  Patient was suggested to have sleep study. He reports that insurance did not approve patient to have sleep study at home. Reports chronic fatigue.  Unchanged at baseline. Otherwise denies any blurry vision, headache, chest pain, leg  swelling.  Review of Systems  Constitutional: Positive for malaise/fatigue. Negative for chills, fever and weight loss.  HENT: Negative for sore throat.   Eyes: Negative for redness.  Respiratory: Negative for cough, shortness of breath and wheezing.   Cardiovascular: Negative for chest pain, palpitations and leg swelling.  Gastrointestinal: Negative for abdominal pain, blood in stool, nausea and vomiting.  Genitourinary: Negative for dysuria.  Musculoskeletal: Negative for myalgias.  Skin: Negative for rash.  Neurological: Negative for dizziness, tingling and tremors.  Endo/Heme/Allergies: Does not bruise/bleed easily.  Psychiatric/Behavioral: Negative for hallucinations.    MEDICAL HISTORY:  Past Medical History:  Diagnosis Date  . Allergy   . Hyperlipidemia   . Hypertrophy, tongue, papillae     SURGICAL HISTORY: History reviewed. No pertinent surgical history.  SOCIAL HISTORY: Social History   Socioeconomic History  . Marital status: Married    Spouse name: Byrd HesselbachMaria   . Number of children: 3  . Years of education: Not on file  . Highest education level: Some college, no degree  Occupational History  . Not on file  Social Needs  . Financial resource strain: Not on file  . Food insecurity:    Worry: Never true    Inability: Never true  . Transportation needs:    Medical: No    Non-medical: No  Tobacco Use  . Smoking status: Never Smoker  . Smokeless tobacco: Current User    Types: Chew  . Tobacco comment: patient has chewed tobacco for 25 years  Substance and Sexual Activity  . Alcohol use: No    Alcohol/week: 0.0 standard drinks  . Drug use: No  . Sexual activity: Yes  Partners: Female  Lifestyle  . Physical activity:    Days per week: 0 days    Minutes per session: 0 min  . Stress: Not at all  Relationships  . Social connections:    Talks on phone: More than three times a week    Gets together: More than three times a week    Attends religious  service: More than 4 times per year    Active member of club or organization: Not on file    Attends meetings of clubs or organizations: Not on file    Relationship status: Married  . Intimate partner violence:    Fear of current or ex partner: No    Emotionally abused: No    Physically abused: No    Forced sexual activity: No  Other Topics Concern  . Not on file  Social History Narrative  . Not on file    FAMILY HISTORY: Family History  Problem Relation Age of Onset  . Arthritis Mother   . Hypertension Mother   . Heart attack Mother        hx of light heart attack  . Hyperlipidemia Mother   . Cancer Father        lung  . Kidney disease Brother        had a transplant (hemodialysis)  . Stroke Maternal Grandmother   . Heart disease Maternal Grandmother   . Kidney disease Maternal Grandmother   . Prostate cancer Neg Hx     ALLERGIES:  has No Known Allergies.  MEDICATIONS:  Current Outpatient Medications  Medication Sig Dispense Refill  . fluticasone (FLONASE) 50 MCG/ACT nasal spray Place 2 sprays into both nostrils daily. 16 g 2  . ibuprofen (ADVIL,MOTRIN) 800 MG tablet Take 1 tablet (800 mg total) by mouth every 8 (eight) hours as needed for moderate pain. 30 tablet 0  . loratadine (CLARITIN) 10 MG tablet Take 1 tablet (10 mg total) by mouth daily. 30 tablet 11  . Olopatadine HCl 0.2 % SOLN Apply 1 drop to eye daily. 2.5 mL 2  . rosuvastatin (CRESTOR) 20 MG tablet Take 1 tablet (20 mg total) by mouth daily. 90 tablet 1   No current facility-administered medications for this visit.      PHYSICAL EXAMINATION: ECOG PERFORMANCE STATUS: 0 - Asymptomatic There were no vitals filed for this visit. There were no vitals filed for this visit.  Physical Exam Constitutional:      General: He is not in acute distress. HENT:     Head: Normocephalic and atraumatic.  Eyes:     General: No scleral icterus.    Conjunctiva/sclera: Conjunctivae normal.     Pupils: Pupils are  equal, round, and reactive to light.  Neck:     Musculoskeletal: Normal range of motion and neck supple.  Cardiovascular:     Rate and Rhythm: Normal rate and regular rhythm.     Heart sounds: Normal heart sounds.  Pulmonary:     Effort: Pulmonary effort is normal. No respiratory distress.     Breath sounds: Normal breath sounds. No wheezing or rales.  Chest:     Chest wall: No tenderness.  Abdominal:     General: Bowel sounds are normal. There is no distension.     Palpations: Abdomen is soft. There is no mass.     Tenderness: There is no abdominal tenderness.  Musculoskeletal: Normal range of motion.        General: No deformity.  Lymphadenopathy:     Cervical:  No cervical adenopathy.  Skin:    General: Skin is warm and dry.     Findings: No erythema or rash.  Neurological:     Mental Status: He is alert and oriented to person, place, and time.     Cranial Nerves: No cranial nerve deficit.     Coordination: Coordination normal.  Psychiatric:        Behavior: Behavior normal.        Thought Content: Thought content normal.      LABORATORY DATA:  I have reviewed the data as listed Lab Results  Component Value Date   WBC 6.3 08/14/2018   HGB 18.0 (H) 08/14/2018   HCT 51.8 08/14/2018   MCV 84.8 08/14/2018   PLT 252 08/14/2018   Recent Labs    11/26/17 1615 03/13/18 1452 06/18/18 1611  NA 138 140 139  K 4.1 3.7 3.9  CL 101 102 103  CO2 30 26 29   GLUCOSE 90 95 96  BUN 13 15 19   CREATININE 0.95 1.03 1.06  CALCIUM 9.7 9.7 9.8  GFRNONAA 98 89 86  GFRAA 114 103 99  PROT 7.6 7.9 7.7  AST 21 24 23   ALT 26 31 29   BILITOT 0.5 0.5 0.4      ASSESSMENT & PLAN:  1. Secondary erythrocytosis    # Secondary erthrocytosis, advised patient to continue follow-up with pulmonology to set up a sleep study. Labs reviewed and discussed with patient. Hemoglobin trended up to 18.  Proceed with therapeutic phlebotomy 500 cc x 1.  Check CBC monthly.  Follow-up in clinic in  12 weeks for reassessment.  . All questions were answered. The patient knows to call the clinic with any problems questions or concerns.  Return of visit: 3 months.   Rickard Patience, MD, PhD Hematology Oncology [facility] Pager- [phone] 08/14/2018

## 2018-08-15 ENCOUNTER — Telehealth: Payer: Self-pay | Admitting: Internal Medicine

## 2018-08-15 DIAGNOSIS — G4719 Other hypersomnia: Secondary | ICD-10-CM

## 2018-08-15 NOTE — Telephone Encounter (Signed)
Please order HST

## 2018-08-15 NOTE — Telephone Encounter (Signed)
Order placed

## 2018-08-15 NOTE — Telephone Encounter (Signed)
Order was placed in August for pt to have an in lab study at [facility]. Split Night study was scheduled for 04/19/2018. Spoke with Marcelino Duster at [facility] and she stated that the patient's insurance [employer]) denied in lab study, however, we didn't receive notification that study was denied.  Per Marcelino Duster at [facility], notice was faxed over to [place].    At this point, since in lab study was denied, can you place an order for a HST?  Please advise. Rhonda J Cobb

## 2018-08-15 NOTE — Telephone Encounter (Signed)
Phone note routed to triage to place HST order. Rhonda J Cobb

## 2018-08-30 DIAGNOSIS — G4733 Obstructive sleep apnea (adult) (pediatric): Secondary | ICD-10-CM

## 2018-09-04 ENCOUNTER — Telehealth: Payer: Self-pay | Admitting: *Deleted

## 2018-09-04 DIAGNOSIS — G4733 Obstructive sleep apnea (adult) (pediatric): Secondary | ICD-10-CM

## 2018-09-04 NOTE — Telephone Encounter (Signed)
Pt aware of results.  Orders placed. 

## 2018-09-10 ENCOUNTER — Other Ambulatory Visit: Payer: Self-pay

## 2018-09-10 DIAGNOSIS — G4719 Other hypersomnia: Secondary | ICD-10-CM

## 2018-09-13 ENCOUNTER — Inpatient Hospital Stay: Payer: BLUE CROSS/BLUE SHIELD | Attending: Oncology

## 2018-10-11 ENCOUNTER — Inpatient Hospital Stay: Payer: BLUE CROSS/BLUE SHIELD

## 2018-10-11 ENCOUNTER — Inpatient Hospital Stay: Payer: BLUE CROSS/BLUE SHIELD | Admitting: Oncology

## 2018-10-14 ENCOUNTER — Inpatient Hospital Stay: Payer: BLUE CROSS/BLUE SHIELD | Attending: Oncology

## 2018-10-23 ENCOUNTER — Telehealth: Payer: Self-pay | Admitting: *Deleted

## 2018-10-23 NOTE — Telephone Encounter (Signed)
Patient 3/20 appts moved out 4-6 weeks per MD New Date:11/26/18 Called patient to make him aware of his NEW scheduled date and times for  Lab/MD/Phlebotomy A detailed message was left on patient's vmail making him aware. Also a remainder letter will be mailed out.

## 2018-10-25 ENCOUNTER — Inpatient Hospital Stay: Payer: BLUE CROSS/BLUE SHIELD | Admitting: Oncology

## 2018-10-25 ENCOUNTER — Inpatient Hospital Stay: Payer: BLUE CROSS/BLUE SHIELD

## 2018-11-25 ENCOUNTER — Telehealth: Payer: Self-pay | Admitting: *Deleted

## 2018-11-25 NOTE — Telephone Encounter (Signed)
I called patient x4 in reference to his 11/26/18 appts. Per MD to have patient labs drawn 1 day prior and offer a WEBEX/DOX Visit on day 2, I was unable to reach him or his wife Byrd Hesselbach) to make them aware. No appts has been changed.

## 2018-11-26 ENCOUNTER — Inpatient Hospital Stay: Payer: BLUE CROSS/BLUE SHIELD | Admitting: Oncology

## 2018-11-26 ENCOUNTER — Inpatient Hospital Stay: Payer: BLUE CROSS/BLUE SHIELD

## 2018-12-19 ENCOUNTER — Ambulatory Visit: Payer: BLUE CROSS/BLUE SHIELD | Admitting: Family Medicine

## 2018-12-19 ENCOUNTER — Encounter: Payer: Self-pay | Admitting: Family Medicine

## 2018-12-19 ENCOUNTER — Other Ambulatory Visit: Payer: Self-pay

## 2018-12-19 VITALS — BP 128/86 | HR 85 | Temp 97.7°F | Resp 16 | Ht 70.0 in | Wt 181.9 lb

## 2018-12-19 DIAGNOSIS — E7800 Pure hypercholesterolemia, unspecified: Secondary | ICD-10-CM

## 2018-12-19 DIAGNOSIS — G4733 Obstructive sleep apnea (adult) (pediatric): Secondary | ICD-10-CM | POA: Diagnosis not present

## 2018-12-19 DIAGNOSIS — D751 Secondary polycythemia: Secondary | ICD-10-CM

## 2018-12-19 DIAGNOSIS — R7303 Prediabetes: Secondary | ICD-10-CM

## 2018-12-19 DIAGNOSIS — M62838 Other muscle spasm: Secondary | ICD-10-CM

## 2018-12-19 MED ORDER — ROSUVASTATIN CALCIUM 20 MG PO TABS: 20.0000 mg | ORAL_TABLET | Freq: Every day | ORAL | 1 refills | Status: DC

## 2018-12-19 MED ORDER — BACLOFEN 20 MG PO TABS: 20.0000 mg | ORAL_TABLET | Freq: Three times a day (TID) | ORAL | 0 refills | Status: DC

## 2018-12-19 MED ORDER — IBUPROFEN 800 MG PO TABS: 800.0000 mg | ORAL_TABLET | Freq: Three times a day (TID) | ORAL | 0 refills | Status: DC | PRN

## 2018-12-19 NOTE — Progress Notes (Signed)
Name: Nicolas White   MRN: [id number]    DOB: 1976/02/11   Date:12/19/2018       Progress Note  Subjective  Chief Complaint  Chief Complaint  Patient presents with  . Shoulder Pain  . Neck Pain    HPI  Hyperlipidemia: he had stopped taking atorvastatin and LDL went up, he is now taking crestor and last level improved, he denies myalgia, no chest pain or palpitation. Unchanged  Erythrocytosis: he is under the care of Dr. Cathie Hoops and was having monthly phlebotomy but last levels was good and it was not required. He is under seeing Dr. Belia Heman and sleep study positive but he has not started CPAP yet.   OSA: had sleep study 09/2018, he was advised to go on auto CPAP 5-15 CmH2O but he was afraid to start it, advised to follow up with Dr. Belia Heman and start CPAP  AR: he states symptoms worse during the spring, but doing well at this time, no rhinorrhea or nasal congestion   Neck or lower back pain: intermittent , taking ibuprofen prn and denies side effects, avoiding heavy lifting, however he states he was working on his yard about 10 day ago and since than he has noticed worsening of left shoulder pain and it radiates to left side of neck, he states for a period of time he had some tingling on left arm and leg but that has improved.   Pre-diabetes: last hgbA1C was slightly elevated, discussed diet , he denies polyphagia, polydipsia or polyuria Discussed again a diabetic diet  Patient Active Problem List   Diagnosis Date Noted  . Erythrocytosis 12/18/2017  . Hyperlipidemia 08/27/2015  . Displacement of cervical intervertebral disc without myelopathy 08/26/2015  . Calculus of kidney 08/26/2015  . Cervical radiculitis 08/26/2015  . Hypertrophy of tongue papillae 08/26/2015  . Allergic rhinitis, seasonal 10/22/2008    History reviewed. No pertinent surgical history.  Family History  Problem Relation Age of Onset  . Arthritis Mother   . Hypertension Mother   . Heart attack Mother    hx of light heart attack  . Hyperlipidemia Mother   . Cancer Father        lung  . Kidney disease Brother        had a transplant (hemodialysis)  . Stroke Maternal Grandmother   . Heart disease Maternal Grandmother   . Kidney disease Maternal Grandmother   . Prostate cancer Neg Hx     Social History   Socioeconomic History  . Marital status: Married    Spouse name: Byrd Hesselbach   . Number of children: 3  . Years of education: Not on file  . Highest education level: Some college, no degree  Occupational History  . Not on file  Social Needs  . Financial resource strain: Not on file  . Food insecurity:    Worry: Never true    Inability: Never true  . Transportation needs:    Medical: No    Non-medical: No  Tobacco Use  . Smoking status: Never Smoker  . Smokeless tobacco: Current User    Types: Chew  . Tobacco comment: patient has chewed tobacco for 25 years  Substance and Sexual Activity  . Alcohol use: No    Alcohol/week: 0.0 standard drinks  . Drug use: No  . Sexual activity: Yes    Partners: Female  Lifestyle  . Physical activity:    Days per week: 0 days    Minutes per session: 0 min  . Stress:  Not at all  Relationships  . Social connections:    Talks on phone: More than three times a week    Gets together: More than three times a week    Attends religious service: More than 4 times per year    Active member of club or organization: Not on file    Attends meetings of clubs or organizations: Not on file    Relationship status: Married  . Intimate partner violence:    Fear of current or ex partner: No    Emotionally abused: No    Physically abused: No    Forced sexual activity: No  Other Topics Concern  . Not on file  Social History Narrative  . Not on file     Current Outpatient Medications:  .  ibuprofen (ADVIL,MOTRIN) 800 MG tablet, Take 1 tablet (800 mg total) by mouth every 8 (eight) hours as needed for moderate pain., Disp: 30 tablet, Rfl: 0 .   loratadine (CLARITIN) 10 MG tablet, Take 1 tablet (10 mg total) by mouth daily., Disp: 30 tablet, Rfl: 11 .  rosuvastatin (CRESTOR) 20 MG tablet, Take 1 tablet (20 mg total) by mouth daily., Disp: 90 tablet, Rfl: 1 .  fluticasone (FLONASE) 50 MCG/ACT nasal spray, Place 2 sprays into both nostrils daily. (Patient not taking: Reported on 12/19/2018), Disp: 16 g, Rfl: 2 .  Olopatadine HCl 0.2 % SOLN, Apply 1 drop to eye daily. (Patient not taking: Reported on 12/19/2018), Disp: 2.5 mL, Rfl: 2  No Known Allergies  I personally reviewed active problem list, medication list, allergies, family history, social history with the patient/caregiver today.   ROS  Constitutional: Negative for fever or weight change.  Respiratory: Negative for cough and shortness of breath.   Cardiovascular: Negative for chest pain or palpitations.  Gastrointestinal: Negative for abdominal pain, no bowel changes.  Musculoskeletal: Negative for gait problem or joint swelling.  Skin: Negative for rash.  Neurological: Negative for dizziness or headache.  No other specific complaints in a complete review of systems (except as listed in HPI above).  Objective  Vitals:   12/19/18 1124  BP: 128/86  Pulse: 85  Resp: 16  Temp: 97.7 F (36.5 C)  TempSrc: Oral  SpO2: 99%  Weight: 181 lb 14.4 oz (82.5 kg)  Height: 5\' 10"  (1.778 m)    Body mass index is 26.1 kg/m.  Physical Exam  Constitutional: Patient appears well-developed and well-nourished. Overweight. No distress.  HEENT: head atraumatic, normocephalic, pupils equal and reactive to light, neck supple, throat within normal limits Cardiovascular: Normal rate, regular rhythm and normal heart sounds.  No murmur heard. No BLE edema. Pulmonary/Chest: Effort normal and breath sounds normal. No respiratory distress. Abdominal: Soft.  There is no tenderness. Muscular skeletal: pain during palpation of left acromioclavicular joint, normal rom of shoulder, mild trapezium  spasm Psychiatric: Patient has a normal mood and affect. behavior is normal. Judgment and thought content normal.  PHQ2/9: [facility] 12/19/2018 03/13/2018 11/26/2017 05/26/2016 11/25/2015  Decreased Interest 0 0 0 0 0  Down, Depressed, Hopeless 0 0 0 0 0  PHQ - 2 Score 0 0 0 0 0  Altered sleeping 2 - - - -  Tired, decreased energy 0 - - - -  Change in appetite 0 - - - -  Feeling bad or failure about yourself  0 - - - -  Trouble concentrating 0 - - - -  Moving slowly or fidgety/restless 0 - - - -  Suicidal thoughts  0 - - - -  PHQ-9 Score 2 - - - -  Difficult doing work/chores Not difficult at all - - - -    phq 9 is negative   Fall Risk: Fall Risk  12/19/2018 06/18/2018 03/13/2018 11/26/2017 05/26/2016  Falls in the past year? 0 0 No No No  Number falls in past yr: 0 0 - - -  Injury with Fall? 0 0 - - -     Functional Status Survey: Is the patient deaf or have difficulty hearing?: No Does the patient have difficulty seeing, even when wearing glasses/contacts?: No Does the patient have difficulty concentrating, remembering, or making decisions?: No Does the patient have difficulty walking or climbing stairs?: No Does the patient have difficulty dressing or bathing?: No Does the patient have difficulty doing errands alone such as visiting a doctor's office or shopping?: No    Assessment & Plan  1. Pure hypercholesterolemia  - rosuvastatin (CRESTOR) 20 MG tablet; Take 1 tablet (20 mg total) by mouth daily.  Dispense: 90 tablet; Refill: 1  2. Erythrocytosis   3. OSA (obstructive sleep apnea)  Needs to contact Dr. Belia Heman and get CPAP machine  4. Neck muscle spasm  - ibuprofen (ADVIL) 800 MG tablet; Take 1 tablet (800 mg total) by mouth every 8 (eight) hours as needed for moderate pain.  Dispense: 90 tablet; Refill: 0 - baclofen (LIORESAL) 20 MG tablet; Take 1 tablet (20 mg total) by mouth 3 (three) times daily. During the week take it only at night  Dispense: 30  each; Refill: 0  5. Prediabetes  On life style modification, recheck A1C yearly

## 2018-12-31 ENCOUNTER — Inpatient Hospital Stay: Payer: BLUE CROSS/BLUE SHIELD

## 2018-12-31 ENCOUNTER — Inpatient Hospital Stay: Payer: BLUE CROSS/BLUE SHIELD | Attending: Oncology

## 2018-12-31 ENCOUNTER — Inpatient Hospital Stay (HOSPITAL_BASED_OUTPATIENT_CLINIC_OR_DEPARTMENT_OTHER): Payer: BLUE CROSS/BLUE SHIELD | Admitting: Oncology

## 2018-12-31 ENCOUNTER — Encounter: Payer: Self-pay | Admitting: Oncology

## 2018-12-31 ENCOUNTER — Other Ambulatory Visit: Payer: Self-pay

## 2018-12-31 VITALS — BP 131/90 | HR 85 | Resp 18

## 2018-12-31 VITALS — BP 146/90 | HR 100 | Temp 96.8°F | Resp 18 | Wt 184.2 lb

## 2018-12-31 DIAGNOSIS — R5382 Chronic fatigue, unspecified: Secondary | ICD-10-CM | POA: Diagnosis not present

## 2018-12-31 DIAGNOSIS — G4730 Sleep apnea, unspecified: Secondary | ICD-10-CM | POA: Diagnosis not present

## 2018-12-31 DIAGNOSIS — R5381 Other malaise: Secondary | ICD-10-CM | POA: Insufficient documentation

## 2018-12-31 DIAGNOSIS — M25512 Pain in left shoulder: Secondary | ICD-10-CM | POA: Insufficient documentation

## 2018-12-31 DIAGNOSIS — E785 Hyperlipidemia, unspecified: Secondary | ICD-10-CM | POA: Diagnosis not present

## 2018-12-31 DIAGNOSIS — Z79899 Other long term (current) drug therapy: Secondary | ICD-10-CM | POA: Insufficient documentation

## 2018-12-31 DIAGNOSIS — Z791 Long term (current) use of non-steroidal anti-inflammatories (NSAID): Secondary | ICD-10-CM | POA: Diagnosis not present

## 2018-12-31 DIAGNOSIS — Z720 Tobacco use: Secondary | ICD-10-CM | POA: Insufficient documentation

## 2018-12-31 DIAGNOSIS — D751 Secondary polycythemia: Secondary | ICD-10-CM | POA: Insufficient documentation

## 2018-12-31 LAB — CBC WITH DIFFERENTIAL/PLATELET
Abs Immature Granulocytes: 0.01 10*3/uL (ref 0.00–0.07)
Basophils Absolute: 0 10*3/uL (ref 0.0–0.1)
Basophils Relative: 1 %
Eosinophils Absolute: 0.1 10*3/uL (ref 0.0–0.5)
Eosinophils Relative: 1 %
HCT: 50.7 % (ref 39.0–52.0)
Hemoglobin: 17.6 g/dL — ABNORMAL HIGH (ref 13.0–17.0)
Immature Granulocytes: 0 %
Lymphocytes Relative: 39 %
Lymphs Abs: 2.4 10*3/uL (ref 0.7–4.0)
MCH: 30.1 pg (ref 26.0–34.0)
MCHC: 34.7 g/dL (ref 30.0–36.0)
MCV: 86.8 fL (ref 80.0–100.0)
Monocytes Absolute: 0.6 10*3/uL (ref 0.1–1.0)
Monocytes Relative: 10 %
Neutro Abs: 2.9 10*3/uL (ref 1.7–7.7)
Neutrophils Relative %: 49 %
Platelets: 244 10*3/uL (ref 150–400)
RBC: 5.84 MIL/uL — ABNORMAL HIGH (ref 4.22–5.81)
RDW: 12.5 % (ref 11.5–15.5)
WBC: 6 10*3/uL (ref 4.0–10.5)
nRBC: 0 % (ref 0.0–0.2)

## 2018-12-31 NOTE — Progress Notes (Signed)
Patient here for follow up. Complians of left shoulder pain.

## 2019-01-07 NOTE — Progress Notes (Signed)
Hematology/Oncology Follow up note [facility] [phone] [phone]) [phone]   Patient Care Team: Alba CorySowles, Krichna, MD as PCP - General (Family Medicine)  REFERRING PROVIDER: Alba CorySowles, Krichna, MD CHIEF COMPLAINTS/PURPOSE OF CONSULTATION:  Evaluation of red blood cell count.   HISTORY OF PRESENTING ILLNESS:  Nicolas White is a  43 y.o.  male with PMH listed below who was referred to me for evaluation of elevated red blood cell count.  Patient had lab work done on November 26, 2017.  Hemoglobin 18.5, HCT 51.9, normal white count with normal differential, normal platelet counts as to 59,000.  Reviewed patient's previous CBC.  He had one CBC done in January 2018 which showed hemoglobin 17.9 with hematocrit 51.4.  Prior than that another CBC done in January 2017 showed hematocrit 51.2.  Patient denies any history of stroke, heart attack, DVT.  He denies smoking cigarettes.  He chews tobacco Patient denies any use of testosterone supplements.  Denies any daytime sleepiness. He feels well at baseline.  Denies any fatigue, weight loss, fever or chills.   #  not have obvious risk factors causing secondary erythrocytosis such as smoking cigarettes or testosterone shots Jak2 [id number] with reflex to CALR/E12/MPL is negative, UA negative, normal EPO level, normal TSH.  BCR Abl negative  #patient has establish care with pulmonology Dr.Kasa., Patient has had chest x-ray done on 04/02/2018 which showed possible generalized airway thickening.  Mild scoliosis.  Patient was suggested to have sleep study. INTERVAL HISTORY Nicolas White is a 43 y.o. male who has above history reviewed by me today presents for follow-up visit for secondary erythrocytosis.  Reports doing well at baseline.  He reports left shoulder pain.  Otherwise doing well. No other new complaints.  Chronic fatigue unchanged.  Otherwise denies any blurry vision, headache, chest pain, leg swelling.  Review  of Systems  Constitutional: Positive for malaise/fatigue. Negative for chills, fever and weight loss.  HENT: Negative for sore throat.   Eyes: Negative for redness.  Respiratory: Negative for cough, shortness of breath and wheezing.   Cardiovascular: Negative for chest pain, palpitations and leg swelling.  Gastrointestinal: Negative for abdominal pain, blood in stool, nausea and vomiting.  Genitourinary: Negative for dysuria.  Musculoskeletal: Negative for myalgias.  Skin: Negative for rash.  Neurological: Negative for dizziness, tingling and tremors.  Endo/Heme/Allergies: Does not bruise/bleed easily.  Psychiatric/Behavioral: Negative for hallucinations.    MEDICAL HISTORY:  Past Medical History:  Diagnosis Date  . Allergy   . Hyperlipidemia   . Hypertrophy, tongue, papillae     SURGICAL HISTORY: History reviewed. No pertinent surgical history.  SOCIAL HISTORY: Social History   Socioeconomic History  . Marital status: Married    Spouse name: Byrd HesselbachMaria   . Number of children: 3  . Years of education: Not on file  . Highest education level: Some college, no degree  Occupational History  . Not on file  Social Needs  . Financial resource strain: Not on file  . Food insecurity:    Worry: Never true    Inability: Never true  . Transportation needs:    Medical: No    Non-medical: No  Tobacco Use  . Smoking status: Never Smoker  . Smokeless tobacco: Current User    Types: Chew  . Tobacco comment: patient has chewed tobacco for 25 years  Substance and Sexual Activity  . Alcohol use: No    Alcohol/week: 0.0 standard drinks  . Drug use: No  . Sexual activity: Yes  Partners: Female  Lifestyle  . Physical activity:    Days per week: 0 days    Minutes per session: 0 min  . Stress: Not at all  Relationships  . Social connections:    Talks on phone: More than three times a week    Gets together: More than three times a week    Attends religious service: More than 4  times per year    Active member of club or organization: Not on file    Attends meetings of clubs or organizations: Not on file    Relationship status: Married  . Intimate partner violence:    Fear of current or ex partner: No    Emotionally abused: No    Physically abused: No    Forced sexual activity: No  Other Topics Concern  . Not on file  Social History Narrative  . Not on file    FAMILY HISTORY: Family History  Problem Relation Age of Onset  . Arthritis Mother   . Hypertension Mother   . Heart attack Mother        hx of light heart attack  . Hyperlipidemia Mother   . Cancer Father        lung  . Kidney disease Brother        had a transplant (hemodialysis)  . Stroke Maternal Grandmother   . Heart disease Maternal Grandmother   . Kidney disease Maternal Grandmother   . Prostate cancer Neg Hx     ALLERGIES:  has No Known Allergies.  MEDICATIONS:  Current Outpatient Medications  Medication Sig Dispense Refill  . baclofen (LIORESAL) 20 MG tablet Take 1 tablet (20 mg total) by mouth 3 (three) times daily. During the week take it only at night 30 each 0  . ibuprofen (ADVIL) 800 MG tablet Take 1 tablet (800 mg total) by mouth every 8 (eight) hours as needed for moderate pain. 90 tablet 0  . loratadine (CLARITIN) 10 MG tablet Take 1 tablet (10 mg total) by mouth daily. 30 tablet 11  . rosuvastatin (CRESTOR) 20 MG tablet Take 1 tablet (20 mg total) by mouth daily. 90 tablet 1   No current facility-administered medications for this visit.      PHYSICAL EXAMINATION: ECOG PERFORMANCE STATUS: 0 - Asymptomatic Vitals:   12/31/18 1420  BP: (!) 146/90  Pulse: 100  Resp: 18  Temp: (!) 96.8 F (36 C)  SpO2: 98%   Filed Weights   12/31/18 1420  Weight: 184 lb 3.2 oz (83.6 kg)    Physical Exam Constitutional:      General: He is not in acute distress. HENT:     Head: Normocephalic and atraumatic.  Eyes:     General: No scleral icterus.    Conjunctiva/sclera:  Conjunctivae normal.     Pupils: Pupils are equal, round, and reactive to light.  Neck:     Musculoskeletal: Normal range of motion and neck supple.  Cardiovascular:     Rate and Rhythm: Normal rate and regular rhythm.     Heart sounds: Normal heart sounds.  Pulmonary:     Effort: Pulmonary effort is normal. No respiratory distress.     Breath sounds: Normal breath sounds. No wheezing or rales.  Chest:     Chest wall: No tenderness.  Abdominal:     General: Bowel sounds are normal. There is no distension.     Palpations: Abdomen is soft. There is no mass.     Tenderness: There is no abdominal tenderness.  Musculoskeletal: Normal range of motion.        General: No deformity.  Lymphadenopathy:     Cervical: No cervical adenopathy.  Skin:    General: Skin is warm and dry.     Findings: No erythema or rash.  Neurological:     Mental Status: He is alert and oriented to person, place, and time.     Cranial Nerves: No cranial nerve deficit.     Coordination: Coordination normal.  Psychiatric:        Behavior: Behavior normal.        Thought Content: Thought content normal.      LABORATORY DATA:  I have reviewed the data as listed Lab Results  Component Value Date   WBC 6.0 12/31/2018   HGB 17.6 (H) 12/31/2018   HCT 50.7 12/31/2018   MCV 86.8 12/31/2018   PLT 244 12/31/2018   Recent Labs    03/13/18 1452 06/18/18 1611  NA 140 139  K 3.7 3.9  CL 102 103  CO2 26 29  GLUCOSE 95 96  BUN 15 19  CREATININE 1.03 1.06  CALCIUM 9.7 9.8  GFRNONAA 89 86  GFRAA 103 99  PROT 7.9 7.7  AST 24 23  ALT 31 29  BILITOT 0.5 0.4      ASSESSMENT & PLAN:  1. Sleep apnea, unspecified type   2. Secondary erythrocytosis    # Secondary erthrocytosis,  Hemoglobin 17.6, with HCT 50. Will proceed with theraputic phlebotomy 500cc x 1 today.   # Sleep apnea, Advise patient to continue follow up with pulmonology to start CPAP   Follow-up in clinic in 16 weeks for reassessment.  .  All questions were answered. The patient knows to call the clinic with any problems questions or concerns.    Rickard Patience, MD, PhD

## 2019-03-17 ENCOUNTER — Encounter: Payer: BLUE CROSS/BLUE SHIELD | Admitting: Family Medicine

## 2019-04-11 ENCOUNTER — Ambulatory Visit: Payer: BLUE CROSS/BLUE SHIELD

## 2019-04-22 ENCOUNTER — Other Ambulatory Visit: Payer: Self-pay

## 2019-04-22 ENCOUNTER — Ambulatory Visit (INDEPENDENT_AMBULATORY_CARE_PROVIDER_SITE_OTHER): Payer: BC Managed Care – PPO

## 2019-04-22 DIAGNOSIS — Z23 Encounter for immunization: Secondary | ICD-10-CM

## 2019-04-29 ENCOUNTER — Other Ambulatory Visit: Payer: Self-pay

## 2019-04-30 ENCOUNTER — Inpatient Hospital Stay: Payer: BC Managed Care – PPO | Attending: Oncology

## 2019-05-02 ENCOUNTER — Inpatient Hospital Stay: Payer: BC Managed Care – PPO | Admitting: Oncology

## 2019-05-02 ENCOUNTER — Inpatient Hospital Stay: Payer: BC Managed Care – PPO

## 2019-05-26 ENCOUNTER — Encounter: Payer: Self-pay | Admitting: Family Medicine

## 2019-05-26 ENCOUNTER — Ambulatory Visit: Payer: Self-pay

## 2019-05-26 ENCOUNTER — Other Ambulatory Visit: Payer: Self-pay

## 2019-05-26 ENCOUNTER — Ambulatory Visit: Payer: BC Managed Care – PPO | Admitting: Family Medicine

## 2019-05-26 VITALS — BP 118/76 | HR 66 | Temp 97.1°F | Resp 16 | Ht 70.0 in | Wt 179.2 lb

## 2019-05-26 DIAGNOSIS — D751 Secondary polycythemia: Secondary | ICD-10-CM | POA: Diagnosis not present

## 2019-05-26 DIAGNOSIS — R7303 Prediabetes: Secondary | ICD-10-CM

## 2019-05-26 DIAGNOSIS — R002 Palpitations: Secondary | ICD-10-CM

## 2019-05-26 DIAGNOSIS — R450 Nervousness: Secondary | ICD-10-CM

## 2019-05-26 DIAGNOSIS — E7800 Pure hypercholesterolemia, unspecified: Secondary | ICD-10-CM | POA: Diagnosis not present

## 2019-05-26 DIAGNOSIS — G4733 Obstructive sleep apnea (adult) (pediatric): Secondary | ICD-10-CM

## 2019-05-26 NOTE — Telephone Encounter (Signed)
Called and left voice message to see if patient can come in to see Dr. Ancil Boozer on today at 1:20pm due to a cancellation on provider's schedule today.

## 2019-05-26 NOTE — Telephone Encounter (Signed)
Pt. Reports he started having "panic feelings and sharp, stabbing pains to chest." Comes and goes. Lasts a few seconds. Rates the pain 6/10. No other symptoms. States his father had heart disease and I need to "get checked out." Denies any pain now. Warm transfer to [facility] in the practice for a visit. Answer Assessment - Initial Assessment Questions 1. LOCATION: "Where does it hurt?"       Hurts on the left side 2. RADIATION: "Does the pain go anywhere else?" (e.g., into neck, jaw, arms, back)     No 3. ONSET: "When did the chest pain begin?" (Minutes, hours or days)      Started on and off 2 weeks ago 4. PATTERN "Does the pain come and go, or has it been constant since it started?"  "Does it get worse with exertion?"      Comes and goes 5. DURATION: "How long does it last" (e.g., seconds, minutes, hours)     Lasts a few seconds 6. SEVERITY: "How bad is the pain?"  (e.g., Scale 1-10; mild, moderate, or severe)    - MILD (1-3): doesn't interfere with normal activities     - MODERATE (4-7): interferes with normal activities or awakens from sleep    - SEVERE (8-10): excruciating pain, unable to do any normal activities        6 7. CARDIAC RISK FACTORS: "Do you have any history of heart problems or risk factors for heart disease?" (e.g., angina, prior heart attack; diabetes, high blood pressure, high cholesterol, smoker, or strong family history of heart disease)     Family history 8. PULMONARY RISK FACTORS: "Do you have any history of lung disease?"  (e.g., blood clots in lung, asthma, emphysema, birth control pills)     No 9. CAUSE: "What do you think is causing the chest pain?"     Unsure - feels panicky 10. OTHER SYMPTOMS: "Do you have any other symptoms?" (e.g., dizziness, nausea, vomiting, sweating, fever, difficulty breathing, cough)       Panic 11. PREGNANCY: "Is there any chance you are pregnant?" "When was your last menstrual period?"       n/a  Protocols used: CHEST PAIN-A-AH

## 2019-05-26 NOTE — Progress Notes (Signed)
Name: Nicolas White   MRN: [id number]    DOB: 1976-05-09   Date:05/26/2019       Progress Note  Subjective  Chief Complaint  Chief Complaint  Patient presents with  . Chest Pain    Sharp chest pain onset on and off over last 2 weeks with jittery feeling    HPI   Jittery sensation : he states he had one episode about one month ago and another episodes yesterday am. He states he was laying in bed and felt jittery, he went for a walk to calm self down. He had some left side chest and palpitation , he had some nausea that lasted all day. No SOB or diaphoresis . He has episodes of hot flashes also. He states he does not know of anything that could be causing him to feel anxious. No change in his job, home life is good, everyone is healthy.   OSA: he has erythrocytosis and EKG today showed possible right atrium enlargement. He had sleep study done 08/2018. He never started CPAP was advised by Dr. Felicie Morn to start at 5-15 mmH2O, he is afraid of noise We will set him up for titration study   Patient Active Problem List   Diagnosis Date Noted  . Erythrocytosis 12/18/2017  . Hyperlipidemia 08/27/2015  . Displacement of cervical intervertebral disc without myelopathy 08/26/2015  . Calculus of kidney 08/26/2015  . Cervical radiculitis 08/26/2015  . Hypertrophy of tongue papillae 08/26/2015  . Allergic rhinitis, seasonal 10/22/2008    No past surgical history on file.    Family History  Problem Relation Age of Onset  . Arthritis Mother   . Hypertension Mother   . Heart attack Mother        hx of light heart attack  . Hyperlipidemia Mother   . Cancer Father        lung  . Kidney disease Brother        had a transplant (hemodialysis)  . Stroke Maternal Grandmother   . Heart disease Maternal Grandmother   . Kidney disease Maternal Grandmother   . Prostate cancer Neg Hx     Social History   Socioeconomic History  . Marital status: Married    Spouse name: Verdis Frederickson   . Number of  children: 3  . Years of education: Not on file  . Highest education level: Some college, no degree  Occupational History  . Not on file  Social Needs  . Financial resource strain: Not on file  . Food insecurity    Worry: Never true    Inability: Never true  . Transportation needs    Medical: No    Non-medical: No  Tobacco Use  . Smoking status: Never Smoker  . Smokeless tobacco: Current User    Types: Chew  . Tobacco comment: patient has chewed tobacco for 25 years  Substance and Sexual Activity  . Alcohol use: No    Alcohol/week: 0.0 standard drinks  . Drug use: No  . Sexual activity: Yes    Partners: Female  Lifestyle  . Physical activity    Days per week: 0 days    Minutes per session: 0 min  . Stress: Not at all  Relationships  . Social connections    Talks on phone: More than three times a week    Gets together: More than three times a week    Attends religious service: More than 4 times per year    Active member of club or organization: Not  on file    Attends meetings of clubs or organizations: Not on file    Relationship status: Married  . Intimate partner violence    Fear of current or ex partner: No    Emotionally abused: No    Physically abused: No    Forced sexual activity: No  Other Topics Concern  . Not on file  Social History Narrative  . Not on file     Current Outpatient Medications:  .  baclofen (LIORESAL) 20 MG tablet, Take 1 tablet (20 mg total) by mouth 3 (three) times daily. During the week take it only at night, Disp: 30 each, Rfl: 0 .  ibuprofen (ADVIL) 800 MG tablet, Take 1 tablet (800 mg total) by mouth every 8 (eight) hours as needed for moderate pain., Disp: 90 tablet, Rfl: 0 .  loratadine (CLARITIN) 10 MG tablet, Take 1 tablet (10 mg total) by mouth daily., Disp: 30 tablet, Rfl: 11 .  rosuvastatin (CRESTOR) 20 MG tablet, Take 1 tablet (20 mg total) by mouth daily., Disp: 90 tablet, Rfl: 1  No Known Allergies  I personally reviewed  active problem list, medication list, allergies, family history, social history, health maintenance with the patient/caregiver today.   ROS  Constitutional: Negative for fever or weight change.  Respiratory: Negative for cough and shortness of breath.   Cardiovascular: Positive for chest pain and  palpitations.  Gastrointestinal: Negative for abdominal pain, no bowel changes.  Musculoskeletal: Negative for gait problem or joint swelling.  Skin: Negative for rash.  Neurological: Negative for dizziness or headache.  No other specific complaints in a complete review of systems (except as listed in HPI above).  Objective  Vitals:   05/26/19 1447  BP: 118/76  Pulse: 66  Resp: 16  Temp: (!) 97.1 F (36.2 C)  SpO2: 98%  Weight: 179 lb 3.2 oz (81.3 kg)  Height: 5\' 10"  (1.778 m)    Body mass index is 25.71 kg/m.  Physical Exam  Constitutional: Patient appears well-developed and well-nourished.  No distress.  HEENT: head atraumatic, normocephalic, pupils equal and reactive to light Cardiovascular: Normal rate, regular rhythm and normal heart sounds.  No murmur heard. No BLE edema. Pulmonary/Chest: Effort normal and breath sounds normal. No respiratory distress. Abdominal: Soft.  There is no tenderness. Psychiatric: Patient has a normal mood and affect. behavior is normal. Judgment and thought content normal.  PHQ2/9:  Depression screen [facility] 05/26/2019 12/19/2018 03/13/2018 11/26/2017 05/26/2016  Decreased Interest 0 0 0 0 0  Down, Depressed, Hopeless 0 0 0 0 0  PHQ - 2 Score 0 0 0 0 0  Altered sleeping 2 2 - - -  Tired, decreased energy 0 0 - - -  Change in appetite 0 0 - - -  Feeling bad or failure about yourself  0 0 - - -  Trouble concentrating 2 0 - - -  Moving slowly or fidgety/restless 0 0 - - -  Suicidal thoughts 0 0 - - -  PHQ-9 Score 4 2 - - -  Difficult doing work/chores Not difficult at all Not difficult at all - - -    phq 9 is negative  GAD 7 : Generalized  Anxiety Score 05/26/2019 11/26/2017  Nervous, Anxious, on Edge 2 0  Control/stop worrying 2 0  Worry too much - different things 2 0  Trouble relaxing 2 0  Restless 2 0  Easily annoyed or irritable 0 0  Afraid - awful might happen 0 0  Total GAD  7 Score 10 0  Anxiety Difficulty Not difficult at all -    Fall Risk: Fall Risk  05/26/2019 12/19/2018 06/18/2018 03/13/2018 11/26/2017  Falls in the past year? 0 0 0 No No  Number falls in past yr: 0 0 0 - -  Injury with Fall? 0 0 0 - -    Functional Status Survey: Is the patient deaf or have difficulty hearing?: No Does the patient have difficulty seeing, even when wearing glasses/contacts?: No Does the patient have difficulty concentrating, remembering, or making decisions?: No Does the patient have difficulty walking or climbing stairs?: No Does the patient have difficulty dressing or bathing?: No Does the patient have difficulty doing errands alone such as visiting a doctor's office or shopping?: No    Assessment & Plan   1. Jittery feeling  Resolved now, it may be anxiety but does not seem to be a reason, went to bed late the night before watching TV, only got 4 hours of sleep, we will check labs and if normal consider prn buspar   2. Erythrocytosis  - CBC with Differential/Platelet  3. Pure hypercholesterolemia  - Lipid panel  4. Prediabetes  - Hemoglobin A1c  5. Palpitation  - CBC with Differential/Platelet - COMPLETE METABOLIC PANEL WITH GFR - EKG 29-FAOZ - TSH  6. OSA (obstructive sleep apnea)  We will try starting CPAP machine EKG showed possible atrial enlargement

## 2019-05-26 NOTE — Telephone Encounter (Signed)
Spoke with patient and he is coming in tomorrow 05-27-2019. Had nothing for today

## 2019-05-27 ENCOUNTER — Encounter: Payer: Self-pay | Admitting: Family Medicine

## 2019-05-27 ENCOUNTER — Ambulatory Visit: Payer: BC Managed Care – PPO | Admitting: Family Medicine

## 2019-05-27 DIAGNOSIS — G4733 Obstructive sleep apnea (adult) (pediatric): Secondary | ICD-10-CM | POA: Insufficient documentation

## 2019-05-27 HISTORY — DX: Obstructive sleep apnea (adult) (pediatric): G47.33

## 2019-05-27 LAB — COMPLETE METABOLIC PANEL WITH GFR
AG Ratio: 1.7 (calc) (ref 1.0–2.5)
ALT: 30 U/L (ref 9–46)
AST: 39 U/L (ref 10–40)
Albumin: 5 g/dL (ref 3.6–5.1)
Alkaline phosphatase (APISO): 79 U/L (ref 36–130)
BUN: 17 mg/dL (ref 7–25)
CO2: 29 mmol/L (ref 20–32)
Calcium: 9.8 mg/dL (ref 8.6–10.3)
Chloride: 102 mmol/L (ref 98–110)
Creat: 1.03 mg/dL (ref 0.60–1.35)
GFR, Est African American: 102 mL/min/{1.73_m2} (ref >=60)
GFR, Est Non African American: 88 mL/min/{1.73_m2} (ref >=60)
Globulin: 2.9 g/dL (calc) (ref 1.9–3.7)
Glucose, Bld: 82 mg/dL (ref 65–99)
Potassium: 4.1 mmol/L (ref 3.5–5.3)
Sodium: 140 mmol/L (ref 135–146)
Total Bilirubin: 0.7 mg/dL (ref 0.2–1.2)
Total Protein: 7.9 g/dL (ref 6.1–8.1)

## 2019-05-27 LAB — LIPID PANEL
Cholesterol: 129 mg/dL (ref <200)
HDL: 46 mg/dL (ref >=40)
LDL Cholesterol (Calc): 70 mg/dL (calc)
Non-HDL Cholesterol (Calc): 83 mg/dL (calc) (ref <130)
Total CHOL/HDL Ratio: 2.8 (calc) (ref <5.0)
Triglycerides: 51 mg/dL (ref <150)

## 2019-05-27 LAB — CBC WITH DIFFERENTIAL/PLATELET
Absolute Monocytes: 744 cells/uL (ref 200–950)
Basophils Absolute: 31 cells/uL (ref 0–200)
Basophils Relative: 0.5 %
Eosinophils Absolute: 49 cells/uL (ref 15–500)
Eosinophils Relative: 0.8 %
HCT: 53.8 % — ABNORMAL HIGH (ref 38.5–50.0)
Hemoglobin: 18.8 g/dL — ABNORMAL HIGH (ref 13.2–17.1)
Lymphs Abs: 2312 cells/uL (ref 850–3900)
MCH: 30.2 pg (ref 27.0–33.0)
MCHC: 34.9 g/dL (ref 32.0–36.0)
MCV: 86.5 fL (ref 80.0–100.0)
MPV: 10 fL (ref 7.5–12.5)
Monocytes Relative: 12.2 %
Neutro Abs: 2965 cells/uL (ref 1500–7800)
Neutrophils Relative %: 48.6 %
Platelets: 284 10*3/uL (ref 140–400)
RBC: 6.22 10*6/uL — ABNORMAL HIGH (ref 4.20–5.80)
RDW: 12.9 % (ref 11.0–15.0)
Total Lymphocyte: 37.9 %
WBC: 6.1 10*3/uL (ref 3.8–10.8)

## 2019-05-27 LAB — TSH: TSH: 2.81 mIU/L (ref 0.40–4.50)

## 2019-05-27 LAB — HEMOGLOBIN A1C
Hgb A1c MFr Bld: 5.9 % of total Hgb — ABNORMAL HIGH (ref <5.7)
Mean Plasma Glucose: 123 (calc)
eAG (mmol/L): 6.8 (calc)

## 2019-06-23 ENCOUNTER — Ambulatory Visit (INDEPENDENT_AMBULATORY_CARE_PROVIDER_SITE_OTHER): Payer: BC Managed Care – PPO | Admitting: Family Medicine

## 2019-06-23 ENCOUNTER — Encounter: Payer: Self-pay | Admitting: Family Medicine

## 2019-06-23 ENCOUNTER — Other Ambulatory Visit: Payer: Self-pay

## 2019-06-23 VITALS — BP 112/76 | HR 99 | Temp 97.1°F | Resp 16 | Ht 67.25 in | Wt 168.8 lb

## 2019-06-23 DIAGNOSIS — G4733 Obstructive sleep apnea (adult) (pediatric): Secondary | ICD-10-CM

## 2019-06-23 DIAGNOSIS — Z0000 Encounter for general adult medical examination without abnormal findings: Secondary | ICD-10-CM

## 2019-06-23 DIAGNOSIS — D751 Secondary polycythemia: Secondary | ICD-10-CM

## 2019-06-23 DIAGNOSIS — F329 Major depressive disorder, single episode, unspecified: Secondary | ICD-10-CM

## 2019-06-23 DIAGNOSIS — R7303 Prediabetes: Secondary | ICD-10-CM | POA: Diagnosis not present

## 2019-06-23 DIAGNOSIS — R454 Irritability and anger: Secondary | ICD-10-CM

## 2019-06-23 DIAGNOSIS — M545 Low back pain, unspecified: Secondary | ICD-10-CM

## 2019-06-23 DIAGNOSIS — F32A Depression, unspecified: Secondary | ICD-10-CM

## 2019-06-23 DIAGNOSIS — E7800 Pure hypercholesterolemia, unspecified: Secondary | ICD-10-CM

## 2019-06-23 DIAGNOSIS — F419 Anxiety disorder, unspecified: Secondary | ICD-10-CM

## 2019-06-23 MED ORDER — ROSUVASTATIN CALCIUM 20 MG PO TABS: 20.0000 mg | ORAL_TABLET | Freq: Every day | ORAL | 1 refills | Status: DC

## 2019-06-23 MED ORDER — HYDROXYZINE HCL 10 MG PO TABS: 10.0000 mg | ORAL_TABLET | Freq: Three times a day (TID) | ORAL | 0 refills | Status: DC | PRN

## 2019-06-23 MED ORDER — ESCITALOPRAM OXALATE 5 MG PO TABS: 5.0000 mg | ORAL_TABLET | Freq: Every day | ORAL | 1 refills | Status: DC

## 2019-06-23 NOTE — Progress Notes (Signed)
Name: Nicolas White   MRN: [id number]    DOB: [birth year]   Date:06/23/2019       Progress Note  Subjective  Chief Complaint  Chief Complaint  Patient presents with  . Annual Exam  . Medication Refill  . Flank Pain    onset-2 weeks, left mid-back  . Pure hypercholesterolemia  . Sleep Apnea  . Hyperlipidemia  . Pre-diabetes  . Stress    States he is more irritated lately and getting ill at the kids. States it is not like him    HPI  Patient presents for annual CPE and follow up  Hyperlipidemia: hehad stopped taking atorvastatin and LDL went up, he is now taking crestor  he denies myalgia, no chest pain or palpitation. Reviewed labs done 05/26/2019, LDL and HDL at goal   Erythrocytosis: he is under the care of Dr. Cathie Hoops and was having monthly phlebotomy but last levels was good and it was not required. He is under seeing Dr. Belia Heman and sleep study positive but he has not started CPAP , I will make sure the order went through for supplies   OSA: had sleep study 09/2018, he was advised to go on auto CPAP 5-15 CmH2O but he was afraid to start it, he states he will go back to see Dr. Belia Heman and discuss it again   Anger outburst: he has been under more stress at work and at home, his wife states that she has noticed he is losing his temper. He feels like he is always on the go. He states he usually likes to hunt and fish but has not had time to do so lately phq 9 negative, discussed taking time for himself. He is anxious about COVID-19. He would like to try medication   Pre-diabetes: last hgbA1C was slightly elevated, last level at 5.9% discussed diet , he denies polyphagia, polydipsia or polyuria. Reminded him of diabetes diet   Back pain: lower back, he states sometimes on left lower side, worse with movement, described as dull, he has been drinking more cranberry juice and water and has been doing better. Explained likely muscular but we can check for urine culture , also described  symptoms of kidney stones  USPSTF grade A and B recommendations:  Diet: he is eating less since he has been stressed but has a balanced diet  Exercise: I am always moving   Depression: phq 9 is positive  Depression screen [facility] 2/9 06/23/2019 05/26/2019 12/19/2018 03/13/2018 11/26/2017  Decreased Interest 0 0 0 0 0  Down, Depressed, Hopeless 1 0 0 0 0  PHQ - 2 Score 1 0 0 0 0  Altered sleeping - -  Tired, decreased energy 0 0 0 - -  Change in appetite 1 0 0 - -  Feeling bad or failure about yourself  0 0 0 - -  Trouble concentrating 0 2 0 - -  Moving slowly or fidgety/restless 2 0 0 - -  Suicidal thoughts 0 0 0 - -  PHQ-9 Score - -  Difficult doing work/chores Somewhat difficult Not difficult at all Not difficult at all - -    Hypertension:  BP Readings from Last 3 Encounters:  06/23/19 112/76  05/26/19 118/76  12/31/18 131/90    Obesity: Wt Readings from Last 3 Encounters:  06/23/19 168 lb 12.8 oz (76.6 kg)  05/26/19 179 lb 3.2 oz (81.3 kg)  12/31/18 184 lb 3.2 oz (83.6 kg)   BMI Readings  from Last 3 Encounters:  06/23/19 26.24 kg/m  05/26/19 25.71 kg/m  12/31/18 26.43 kg/m     Lipids:  Lab Results  Component Value Date   CHOL 129 05/26/2019   CHOL 138 06/18/2018   CHOL 181 03/13/2018   Lab Results  Component Value Date   HDL 46 05/26/2019   HDL 34 (L) 06/18/2018   HDL 40 (L) 03/13/2018   Lab Results  Component Value Date   LDLCALC 70 05/26/2019   LDLCALC 74 06/18/2018   LDLCALC 113 (H) 03/13/2018   Lab Results  Component Value Date   TRIG 51 05/26/2019   TRIG 201 (H) 06/18/2018   TRIG 162 (H) 03/13/2018   Lab Results  Component Value Date   CHOLHDL 2.8 05/26/2019   CHOLHDL 4.1 06/18/2018   CHOLHDL 4.5 03/13/2018   No results found for: LDLDIRECT Glucose:  Glucose, Bld  Date Value Ref Range Status  05/26/2019 82 65 - 99 mg/dL Final    Comment:    .            Fasting reference interval .   06/18/2018 96 65 - 139 mg/dL Final     Comment:    .        Non-fasting reference interval .   03/13/2018 95 65 - 139 mg/dL Final    Comment:    .        Non-fasting reference interval .       Office Visit from 05/26/2019 in [facility]  AUDIT-C Score  0       Married STD testing and prevention (HIV/chl/gon/syphilis): N/A Hep C: next visit   Skin cancer: discussed atypical lesions  Colorectal cancer: discussed now approved for age 70 and above  Prostate cancer: discussed USPTF  IPSS Questionnaire (AUA-7): Over the past month.   1)  How often have you had a sensation of not emptying your bladder completely after you finish urinating?  0 - Not at all  2)  How often have you had to urinate again less than two hours after you finished urinating? 0 - Not at all  3)  How often have you found you stopped and started again several times when you urinated?  0 - Not at all  4) How difficult have you found it to postpone urination?  0 - Not at all  5) How often have you had a weak urinary stream?  0 - Not at all  6) How often have you had to push or strain to begin urination?  0 - Not at all  7) How many times did you most typically get up to urinate from the time you went to bed until the time you got up in the morning?  0 - None  Total score:  0-7 mildly symptomatic   8-19 moderately symptomatic   20-35 severely symptomatic    Lung cancer:  Low Dose CT Chest recommended if Age 21-80 years, 30 pack-year currently smoking OR have quit w/in 15years. Patient does not qualify.   ECG:  05/2019  Advanced Care Planning: A voluntary discussion about advance care planning including the explanation and discussion of advance directives.  Discussed health care proxy and Living will, and the patient was able to identify a health care proxy as wife   Patient does not have a living will at present time. If patient does have living will, I have requested they bring this to the clinic to be scanned in to their  chart.  Patient Active Problem List   Diagnosis Date Noted  . OSA (obstructive sleep apnea) 05/27/2019  . Erythrocytosis 12/18/2017  . Hyperlipidemia 08/27/2015  . Displacement of cervical intervertebral disc without myelopathy 08/26/2015  . Calculus of kidney 08/26/2015  . Cervical radiculitis 08/26/2015  . Hypertrophy of tongue papillae 08/26/2015  . Allergic rhinitis, seasonal 10/22/2008    History reviewed. No pertinent surgical history.  Family History  Problem Relation Age of Onset  . Arthritis Mother   . Hypertension Mother   . Heart attack Mother        hx of light heart attack  . Hyperlipidemia Mother   . Cancer Father        lung  . Kidney disease Brother        had a transplant (hemodialysis)  . Stroke Maternal Grandmother   . Heart disease Maternal Grandmother   . Kidney disease Maternal Grandmother   . Prostate cancer Neg Hx     Social History   Socioeconomic History  . Marital status: Married    Spouse name: Byrd Hesselbach   . Number of children: 3  . Years of education: Not on file  . Highest education level: Some college, no degree  Occupational History  . Not on file  Social Needs  . Financial resource strain: Not hard at all  . Food insecurity    Worry: Never true    Inability: Never true  . Transportation needs    Medical: No    Non-medical: No  Tobacco Use  . Smoking status: Never Smoker  . Smokeless tobacco: Current User    Types: Chew  . Tobacco comment: patient has chewed tobacco for 25 years  Substance and Sexual Activity  . Alcohol use: No    Alcohol/week: 0.0 standard drinks  . Drug use: No  . Sexual activity: Yes    Partners: Female  Lifestyle  . Physical activity    Days per week: 0 days    Minutes per session: 0 min  . Stress: Not at all  Relationships  . Social connections    Talks on phone: More than three times a week    Gets together: More than three times a week    Attends religious service: More than 4 times per year     Active member of club or organization: Not on file    Attends meetings of clubs or organizations: Not on file    Relationship status: Married  . Intimate partner violence    Fear of current or ex partner: No    Emotionally abused: No    Physically abused: No    Forced sexual activity: No  Other Topics Concern  . Not on file  Social History Narrative  . Not on file     Current Outpatient Medications:  .  ibuprofen (ADVIL) 800 MG tablet, Take 1 tablet (800 mg total) by mouth every 8 (eight) hours as needed for moderate pain., Disp: 90 tablet, Rfl: 0 .  loratadine (CLARITIN) 10 MG tablet, Take 1 tablet (10 mg total) by mouth daily., Disp: 30 tablet, Rfl: 11 .  rosuvastatin (CRESTOR) 20 MG tablet, Take 1 tablet (20 mg total) by mouth daily., Disp: 90 tablet, Rfl: 1 .  escitalopram (LEXAPRO) 5 MG tablet, Take 1 tablet (5 mg total) by mouth daily., Disp: 30 tablet, Rfl: 1 .  hydrOXYzine (ATARAX/VISTARIL) 10 MG tablet, Take 1 tablet (10 mg total) by mouth 3 (three) times daily as needed., Disp: 30 tablet, Rfl: 0  No  Known Allergies   ROS  Constitutional: Negative for fever or weight change.  Respiratory: Negative for cough and shortness of breath.   Cardiovascular: Negative for chest pain or palpitations.  Gastrointestinal: Negative for abdominal pain, no bowel changes.  Musculoskeletal: Negative for gait problem or joint swelling.  Skin: Negative for rash.  Neurological: Negative for dizziness or headache.  No other specific complaints in a complete review of systems (except as listed in HPI above).  Objective  Vitals:   06/23/19 1405  BP: 112/76  Pulse: 99  Resp: 16  Temp: (!) 97.1 F (36.2 C)  TempSrc: Temporal  SpO2: 97%  Weight: 168 lb 12.8 oz (76.6 kg)  Height: 5' 7.25" (1.708 m)    Body mass index is 26.24 kg/m.  Physical Exam  Constitutional: Patient appears well-developed and well-nourished. No distress.  HENT: Head: Normocephalic and atraumatic. Ears: B TMs  ok, no erythema or effusion; Nose: Nose normal. Mouth/Throat: Oropharynx is clear and moist. No oropharyngeal exudate.  Eyes: Conjunctivae and EOM are normal. Pupils are equal, round, and reactive to light. No scleral icterus.  Neck: Normal range of motion. Neck supple. No JVD present. No thyromegaly present.  Cardiovascular: Normal rate, regular rhythm and normal heart sounds.  No murmur heard. No BLE edema. Pulmonary/Chest: Effort normal and breath sounds normal. No respiratory distress. Abdominal: Soft. Bowel sounds are normal, no distension. There is no tenderness. no masses MALE GENITALIA: Normal descended testes bilaterally, no masses palpated, no hernias, no lesions, no discharge RECTAL:not done Musculoskeletal: Normal range of motion, no joint effusions. No gross deformities Neurological: he is alert and oriented to person, place, and time. No cranial nerve deficit. Coordination, balance, strength, speech and gait are normal.  Skin: Skin is warm and dry. No rash noted. No erythema.  Psychiatric: Patient has a normal mood and affect. behavior is normal. Judgment and thought content normal.   Recent Results (from the past 2160 hour(s))  Lipid panel     Status: None   Collection Time: 05/26/19 12:00 AM  Result Value Ref Range   Cholesterol 129 <200 mg/dL   HDL 46 > OR = 40 mg/dL   Triglycerides 51 <150 mg/dL   LDL Cholesterol (Calc) 70 mg/dL (calc)    Comment: Reference range: <100 . Desirable range <100 mg/dL for primary prevention;   <70 mg/dL for patients with CHD or diabetic patients  with > or = 2 CHD risk factors. Marland Kitchen LDL-C is now calculated using the Martin-Hopkins  calculation, which is a validated novel method providing  better accuracy than the Friedewald equation in the  estimation of LDL-C.  Cresenciano Genre et al. Annamaria Helling. 1062;694(85): 2061-2068  (http://education.[facility].com/faq/FAQ164)    Total CHOL/HDL Ratio 2.8 <5.0 (calc)   Non-HDL Cholesterol (Calc) 83 <130  mg/dL (calc)    Comment: For patients with diabetes plus 1 major ASCVD risk  factor, treating to a non-HDL-C goal of <100 mg/dL  (LDL-C of <70 mg/dL) is considered a therapeutic  option.   CBC with Differential/Platelet     Status: Abnormal   Collection Time: 05/26/19 12:00 AM  Result Value Ref Range   WBC 6.1 3.8 - 10.8 Thousand/uL   RBC 6.22 (H) 4.20 - 5.80 Million/uL   Hemoglobin 18.8 (H) 13.2 - 17.1 g/dL   HCT 53.8 (H) 38.5 - 50.0 %   MCV 86.5 80.0 - 100.0 fL   MCH 30.2 27.0 - 33.0 pg   MCHC 34.9 32.0 - 36.0 g/dL   RDW 12.9 11.0 - 15.0 %  Platelets 284 140 - 400 Thousand/uL   MPV 10.0 7.5 - 12.5 fL   Neutro Abs 2,965 1,500 - 7,800 cells/uL   Lymphs Abs 2,312 850 - 3,900 cells/uL   Absolute Monocytes 744 200 - 950 cells/uL   Eosinophils Absolute 49 15 - 500 cells/uL   Basophils Absolute 31 0 - 200 cells/uL   Neutrophils Relative % 48.6 %   Total Lymphocyte 37.9 %   Monocytes Relative 12.2 %   Eosinophils Relative 0.8 %   Basophils Relative 0.5 %  COMPLETE METABOLIC PANEL WITH GFR     Status: None   Collection Time: 05/26/19 12:00 AM  Result Value Ref Range   Glucose, Bld 82 65 - 99 mg/dL    Comment: .            Fasting reference interval .    BUN 17 7 - 25 mg/dL   Creat 3.61 4.43 - 1.54 mg/dL   GFR, Est Non African American 88 > OR = 60 mL/min/1.63m2   GFR, Est African American 102 > OR = 60 mL/min/1.87m2   BUN/Creatinine Ratio NOT APPLICABLE 6 - 22 (calc)   Sodium 140 135 - 146 mmol/L   Potassium 4.1 3.5 - 5.3 mmol/L   Chloride 102 98 - 110 mmol/L   CO2 29 20 - 32 mmol/L   Calcium 9.8 8.6 - 10.3 mg/dL   Total Protein 7.9 6.1 - 8.1 g/dL   Albumin 5.0 3.6 - 5.1 g/dL   Globulin 2.9 1.9 - 3.7 g/dL (calc)   AG Ratio 1.7 1.0 - 2.5 (calc)   Total Bilirubin 0.7 0.2 - 1.2 mg/dL   Alkaline phosphatase (APISO) 79 36 - 130 U/L   AST 39 10 - 40 U/L   ALT 30 9 - 46 U/L  Hemoglobin A1c     Status: Abnormal   Collection Time: 05/26/19 12:00 AM  Result Value Ref Range    Hgb A1c MFr Bld 5.9 (H) <5.7 % of total Hgb    Comment: For someone without known diabetes, a hemoglobin  A1c value between 5.7% and 6.4% is consistent with prediabetes and should be confirmed with a  follow-up test. . For someone with known diabetes, a value <7% indicates that their diabetes is well controlled. A1c targets should be individualized based on duration of diabetes, age, comorbid conditions, and other considerations. . This assay result is consistent with an increased risk of diabetes. . Currently, no consensus exists regarding use of hemoglobin A1c for diagnosis of diabetes for children. .    Mean Plasma Glucose 123 (calc)   eAG (mmol/L) 6.8 (calc)  TSH     Status: None   Collection Time: 05/26/19 12:00 AM  Result Value Ref Range   TSH 2.81 0.40 - 4.50 mIU/L     PHQ2/9: Depression screen [facility] 2/9 06/23/2019 05/26/2019 12/19/2018 03/13/2018 11/26/2017  Decreased Interest 0 0 0 0 0  Down, Depressed, Hopeless 1 0 0 0 0  PHQ - 2 Score 1 0 0 0 0  Altered sleeping 1 2 2  - -  Tired, decreased energy 0 0 0 - -  Change in appetite 1 0 0 - -  Feeling bad or failure about yourself  0 0 0 - -  Trouble concentrating 0 2 0 - -  Moving slowly or fidgety/restless 2 0 0 - -  Suicidal thoughts 0 0 0 - -  PHQ-9 Score 5 4 2  - -  Difficult doing work/chores Somewhat difficult Not difficult at all Not difficult  at all - -     Fall Risk: Fall Risk  06/23/2019 05/26/2019 12/19/2018 06/18/2018 03/13/2018  Falls in the past year? 0 0 0 0 No  Number falls in past yr: 0 0 0 0 -  Injury with Fall? 0 0 0 0 -    Functional Status Survey: Is the patient deaf or have difficulty hearing?: No Does the patient have difficulty seeing, even when wearing glasses/contacts?: No Does the patient have difficulty concentrating, remembering, or making decisions?: No Does the patient have difficulty walking or climbing stairs?: No Does the patient have difficulty dressing or bathing?: No Does the  patient have difficulty doing errands alone such as visiting a doctor's office or shopping?: No    Assessment & Plan  1. Well adult exam   2. Prediabetes  Reviewed a diabetic diet   3. Erythrocytosis  Reconsider going back to hematologist   4. OSA (obstructive sleep apnea)  He will follow up with Dr. Belia HemanKasa  5. Pure hypercholesterolemia  Continue medications  6. Anxiety and depression  - escitalopram (LEXAPRO) 5 MG tablet; Take 1 tablet (5 mg total) by mouth daily.  Dispense: 30 tablet; Refill: 1 - hydrOXYzine (ATARAX/VISTARIL) 10 MG tablet; Take 1 tablet (10 mg total) by mouth 3 (three) times daily as needed.  Dispense: 30 tablet; Refill: 0  7. Anger reaction  - hydrOXYzine (ATARAX/VISTARIL) 10 MG tablet; Take 1 tablet (10 mg total) by mouth 3 (three) times daily as needed.  Dispense: 30 tablet; Refill: 0  8. Acute left-sided low back pain without sciatica  - Urine Culture  -Prostate cancer screening and PSA options (with potential risks and benefits of testing vs not testing) were discussed along with recent recs/guidelines. -USPSTF grade A and B recommendations reviewed with patient; age-appropriate recommendations, preventive care, screening tests, etc discussed and encouraged; healthy living encouraged; see AVS for patient education given to patient -Discussed importance of 150 minutes of physical activity weekly, eat two servings of fish weekly, eat one serving of tree nuts ( cashews, pistachios, pecans, almonds.Marland Kitchen.) every other day, eat 6 servings of fruit/vegetables daily and drink plenty of water and avoid sweet beverages.

## 2019-06-23 NOTE — Patient Instructions (Signed)
Preventive Care 40-44 Years Old, Male Preventive care refers to lifestyle choices and visits with your health care provider that can promote health and wellness. This includes:  A yearly physical exam. This is also called an annual well check.  Regular dental and eye exams.  Immunizations.  Screening for certain conditions.  Healthy lifestyle choices, such as eating a healthy diet, getting regular exercise, not using drugs or products that contain nicotine and tobacco, and limiting alcohol use. What can I expect for my preventive care visit? Physical exam Your health care provider will check:  Height and weight. These may be used to calculate body mass index (BMI), which is a measurement that tells if you are at a healthy weight.  Heart rate and blood pressure.  Your skin for abnormal spots. Counseling Your health care provider may ask you questions about:  Alcohol, tobacco, and drug use.  Emotional well-being.  Home and relationship well-being.  Sexual activity.  Eating habits.  Work and work environment. What immunizations do I need?  Influenza (flu) vaccine  This is recommended every year. Tetanus, diphtheria, and pertussis (Tdap) vaccine  You may need a Td booster every 10 years. Varicella (chickenpox) vaccine  You may need this vaccine if you have not already been vaccinated. Zoster (shingles) vaccine  You may need this after age 60. Measles, mumps, and rubella (MMR) vaccine  You may need at least one dose of MMR if you were born in 1957 or later. You may also need a second dose. Pneumococcal conjugate (PCV13) vaccine  You may need this if you have certain conditions and were not previously vaccinated. Pneumococcal polysaccharide (PPSV23) vaccine  You may need one or two doses if you smoke cigarettes or if you have certain conditions. Meningococcal conjugate (MenACWY) vaccine  You may need this if you have certain conditions. Hepatitis A vaccine   You may need this if you have certain conditions or if you travel or work in places where you may be exposed to hepatitis A. Hepatitis B vaccine  You may need this if you have certain conditions or if you travel or work in places where you may be exposed to hepatitis B. Haemophilus influenzae type b (Hib) vaccine  You may need this if you have certain risk factors. Human papillomavirus (HPV) vaccine  If recommended by your health care provider, you may need three doses over 6 months. You may receive vaccines as individual doses or as more than one vaccine together in one shot (combination vaccines). Talk with your health care provider about the risks and benefits of combination vaccines. What tests do I need? Blood tests  Lipid and cholesterol levels. These may be checked every 5 years, or more frequently if you are over 50 years old.  Hepatitis C test.  Hepatitis B test. Screening  Lung cancer screening. You may have this screening every year starting at age 55 if you have a 30-pack-year history of smoking and currently smoke or have quit within the past 15 years.  Prostate cancer screening. Recommendations will vary depending on your family history and other risks.  Colorectal cancer screening. All adults should have this screening starting at age 50 and continuing until age 75. Your health care provider may recommend screening at age 45 if you are at increased risk. You will have tests every 1-10 years, depending on your results and the type of screening test.  Diabetes screening. This is done by checking your blood sugar (glucose) after you have not eaten   for a while (fasting). You may have this done every 1-3 years.  Sexually transmitted disease (STD) testing. Follow these instructions at home: Eating and drinking  Eat a diet that includes fresh fruits and vegetables, whole grains, lean protein, and low-fat dairy products.  Take vitamin and mineral supplements as recommended  by your health care provider.  Do not drink alcohol if your health care provider tells you not to drink.  If you drink alcohol: ? Limit how much you have to 0-2 drinks a day. ? Be aware of how much alcohol is in your drink. In the [place] one drink equals one 12 oz bottle of beer (355 mL), one 5 oz glass of wine (148 mL), or one 1 oz glass of hard liquor (44 mL). Lifestyle  Take daily care of your teeth and gums.  Stay active. Exercise for at least 30 minutes on 5 or more days each week.  Do not use any products that contain nicotine or tobacco, such as cigarettes, e-cigarettes, and chewing tobacco. If you need help quitting, ask your health care provider.  If you are sexually active, practice safe sex. Use a condom or other form of protection to prevent STIs (sexually transmitted infections).  Talk with your health care provider about taking a low-dose aspirin every day starting at age 33. What's next?  Go to your health care provider once a year for a well check visit.  Ask your health care provider how often you should have your eyes and teeth checked.  Stay up to date on all vaccines. This information is not intended to replace advice given to you by your health care provider. Make sure you discuss any questions you have with your health care provider. Document Released: 08/20/2015 Document Revised: 07/18/2018 Document Reviewed: 07/18/2018 Elsevier Patient Education  2020 [employer].

## 2019-06-24 LAB — URINE CULTURE
MICRO NUMBER:: 1105111
Result:: NO GROWTH
SPECIMEN QUALITY:: ADEQUATE

## 2019-06-25 ENCOUNTER — Ambulatory Visit: Payer: BC Managed Care – PPO | Admitting: Family Medicine

## 2019-08-11 ENCOUNTER — Ambulatory Visit: Payer: BC Managed Care – PPO | Admitting: Family Medicine

## 2019-08-11 ENCOUNTER — Other Ambulatory Visit: Payer: Self-pay | Admitting: Family Medicine

## 2019-08-11 DIAGNOSIS — M62838 Other muscle spasm: Secondary | ICD-10-CM

## 2019-08-12 ENCOUNTER — Encounter: Payer: Self-pay | Admitting: Family Medicine

## 2019-08-12 ENCOUNTER — Other Ambulatory Visit: Payer: Self-pay | Admitting: Family Medicine

## 2019-08-12 ENCOUNTER — Inpatient Hospital Stay: Admission: RE | Admit: 2019-08-12 | Payer: BC Managed Care – PPO | Source: Ambulatory Visit

## 2019-08-12 ENCOUNTER — Ambulatory Visit (INDEPENDENT_AMBULATORY_CARE_PROVIDER_SITE_OTHER): Payer: BC Managed Care – PPO | Admitting: Family Medicine

## 2019-08-12 DIAGNOSIS — F329 Major depressive disorder, single episode, unspecified: Secondary | ICD-10-CM | POA: Diagnosis not present

## 2019-08-12 DIAGNOSIS — F419 Anxiety disorder, unspecified: Secondary | ICD-10-CM | POA: Diagnosis not present

## 2019-08-12 DIAGNOSIS — M62838 Other muscle spasm: Secondary | ICD-10-CM

## 2019-08-12 DIAGNOSIS — M545 Low back pain, unspecified: Secondary | ICD-10-CM

## 2019-08-12 DIAGNOSIS — E7800 Pure hypercholesterolemia, unspecified: Secondary | ICD-10-CM

## 2019-08-12 DIAGNOSIS — R454 Irritability and anger: Secondary | ICD-10-CM

## 2019-08-12 DIAGNOSIS — F32A Depression, unspecified: Secondary | ICD-10-CM

## 2019-08-12 MED ORDER — IBUPROFEN 800 MG PO TABS: 800.0000 mg | ORAL_TABLET | Freq: Three times a day (TID) | ORAL | 0 refills | Status: DC | PRN

## 2019-08-12 MED ORDER — CYCLOBENZAPRINE HCL 5 MG PO TABS: 5.0000 mg | ORAL_TABLET | Freq: Every day | ORAL | 0 refills | Status: DC

## 2019-08-12 NOTE — Progress Notes (Signed)
Name: Nicolas White   MRN: [id number]    DOB: September 25, 1975   Date:08/12/2019       Progress Note  Subjective  Chief Complaint  Chief Complaint  Patient presents with  . Anxiety  . Depression  . Back Pain    Lower left side of his back. Pain is dull and radiates to his left testicle. He needs a refill on his Iburprofen. It usuallly helps some with his back pain.  Marland Kitchen Hyperlipidemia    Needs refill on his Crestor.    I connected with  Rachel Bo on 08/12/19 at  3:20 PM EST by telephone and verified that I am speaking with the correct person using two identifiers.  I discussed the limitations, risks, security and privacy concerns of performing an evaluation and management service by telephone and the availability of in person appointments. Staff also discussed with the patient that there may be a patient responsible charge related to this service. Patient Location: at home Provider Location: [facility]   HPI  Anger outburst: he was seen in Nov 2020 and was having anger outbursts, he agreed on starting Lexapro and Hydroxizine, he states after a few days his symptoms resolved and he stopped taking medications. His wife states he is back to his normal self. Explained medication does not work that fast and to resume taking it if needed   Back pain: lower back, he states sometimes on left lower side, worse with movement, he has spasms, sometimes radiates to left testicle. Pain is about every other day, worse when he is more active and bending his back more than usual. It causes back stiffness. He also has some tail bone pain when sitting for a long time   Patient Active Problem List   Diagnosis Date Noted  . OSA (obstructive sleep apnea) 05/27/2019  . Erythrocytosis 12/18/2017  . Hyperlipidemia 08/27/2015  . Displacement of cervical intervertebral disc without myelopathy 08/26/2015  . Calculus of kidney 08/26/2015  . Cervical radiculitis 08/26/2015  . Hypertrophy of  tongue papillae 08/26/2015  . Allergic rhinitis, seasonal 10/22/2008    History reviewed. No pertinent surgical history.  Family History  Problem Relation Age of Onset  . Arthritis Mother   . Hypertension Mother   . Heart attack Mother        hx of light heart attack  . Hyperlipidemia Mother   . Cancer Father        lung  . Kidney disease Brother        had a transplant (hemodialysis)  . Stroke Maternal Grandmother   . Heart disease Maternal Grandmother   . Kidney disease Maternal Grandmother   . Prostate cancer Neg Hx      Current Outpatient Medications:  .  ibuprofen (ADVIL) 800 MG tablet, Take 1 tablet (800 mg total) by mouth every 8 (eight) hours as needed for moderate pain., Disp: 90 tablet, Rfl: 0 .  rosuvastatin (CRESTOR) 20 MG tablet, Take 1 tablet by mouth once daily, Disp: 90 tablet, Rfl: 0 .  escitalopram (LEXAPRO) 5 MG tablet, Take 1 tablet (5 mg total) by mouth daily. (Patient not taking: Reported on 08/12/2019), Disp: 30 tablet, Rfl: 1 .  hydrOXYzine (ATARAX/VISTARIL) 10 MG tablet, Take 1 tablet (10 mg total) by mouth 3 (three) times daily as needed. (Patient not taking: Reported on 08/12/2019), Disp: 30 tablet, Rfl: 0 .  loratadine (CLARITIN) 10 MG tablet, Take 1 tablet (10 mg total) by mouth daily. (Patient not taking: Reported on  08/12/2019), Disp: 30 tablet, Rfl: 11  No Known Allergies  I personally reviewed active problem list, medication list, allergies, family history, social history, health maintenance with the patient/caregiver today.   ROS  Ten systems reviewed and is negative except as mentioned in HPI   Objective  Virtual encounter, vitals not obtained.  There is no height or weight on file to calculate BMI.  Physical Exam  Awake, alert and oriented   PHQ2/9: Depression screen [facility] 2/9 08/12/2019 06/23/2019 05/26/2019 12/19/2018 03/13/2018  Decreased Interest 0 0 0 0 0  Down, Depressed, Hopeless 0 1 0 0 0  PHQ - 2 Score 0 1 0 0 0  Altered sleeping  0 1 2 2  -  Tired, decreased energy 0 0 0 0 -  Change in appetite 0 1 0 0 -  Feeling bad or failure about yourself  0 0 0 0 -  Trouble concentrating 0 0 2 0 -  Moving slowly or fidgety/restless 0 2 0 0 -  Suicidal thoughts 0 0 0 0 -  PHQ-9 Score 0 5 4 2  -  Difficult doing work/chores - Somewhat difficult Not difficult at all Not difficult at all -   PHQ-2/9 Result is negative.    Fall Risk: Fall Risk  08/12/2019 06/23/2019 05/26/2019 12/19/2018 06/18/2018  Falls in the past year? 0 0 0 0 0  Number falls in past yr: 0 0 0 0 0  Injury with Fall? 0 0 0 0 0     Assessment & Plan  1. Anger reaction   2. Anxiety and depression   3. Intermittent low back pain  - ibuprofen (ADVIL) 800 MG tablet; Take 1 tablet (800 mg total) by mouth every 8 (eight) hours as needed for moderate pain.  Dispense: 90 tablet; Refill: 0 - cyclobenzaprine (FLEXERIL) 5 MG tablet; Take 1-2 tablets (5-10 mg total) by mouth at bedtime.  Dispense: 30 tablet; Refill: 0   I discussed the assessment and treatment plan with the patient. The patient was provided an opportunity to ask questions and all were answered. The patient agreed with the plan and demonstrated an understanding of the instructions.   The patient was advised to call back or seek an in-person evaluation if the symptoms worsen or if the condition fails to improve as anticipated.  I provided 15  minutes of non-face-to-face time during this encounter.  12/21/2018, MD

## 2019-08-26 ENCOUNTER — Ambulatory Visit: Payer: BC Managed Care – PPO | Attending: Internal Medicine

## 2019-08-26 ENCOUNTER — Other Ambulatory Visit: Payer: Self-pay

## 2019-08-26 DIAGNOSIS — Z20822 Contact with and (suspected) exposure to covid-19: Secondary | ICD-10-CM | POA: Diagnosis not present

## 2019-08-27 LAB — NOVEL CORONAVIRUS, NAA: SARS-CoV-2, NAA: DETECTED — AB

## 2019-09-15 ENCOUNTER — Encounter: Payer: Self-pay | Admitting: Family Medicine

## 2019-09-19 ENCOUNTER — Telehealth (INDEPENDENT_AMBULATORY_CARE_PROVIDER_SITE_OTHER): Payer: BC Managed Care – PPO | Admitting: Family Medicine

## 2019-09-19 ENCOUNTER — Encounter: Payer: Self-pay | Admitting: Family Medicine

## 2019-09-19 ENCOUNTER — Other Ambulatory Visit: Payer: Self-pay

## 2019-09-19 VITALS — BP 130/82 | HR 88 | Temp 96.8°F | Resp 16 | Ht 70.0 in | Wt 183.7 lb

## 2019-09-19 DIAGNOSIS — M545 Low back pain, unspecified: Secondary | ICD-10-CM

## 2019-09-19 NOTE — Progress Notes (Signed)
Name: Nicolas White   MRN: [id number]    DOB: [birth year]   Date:09/19/2019       Progress Note  Subjective  Chief Complaint  Chief Complaint  Patient presents with  . Male GU Problem    HPI  Left testicular lump: he states he has a lump on left testicle for the past 10 years but he states he noticed some  left lower back pain and got concerned about I because sometimes the pain causes radiation to the testicle. He states the back resolved with mobic and muscle relaxer.   Back pain: he states the pain was dull on left lower back, it was radiating to left testicle, he states no dysuria, urinary frequency or blood in urine. No bowel or bladder incontinence. The symptoms started about one week ago , but since he resumed mobic and muscle relaxer pain resolved.    Patient Active Problem List   Diagnosis Date Noted  . OSA (obstructive sleep apnea) 05/27/2019  . Erythrocytosis 12/18/2017  . Hyperlipidemia 08/27/2015  . Displacement of cervical intervertebral disc without myelopathy 08/26/2015  . Calculus of kidney 08/26/2015  . Cervical radiculitis 08/26/2015  . Hypertrophy of tongue papillae 08/26/2015  . Allergic rhinitis, seasonal 10/22/2008    No past surgical history on file.  Family History  Problem Relation Age of Onset  . Arthritis Mother   . Hypertension Mother   . Heart attack Mother        hx of light heart attack  . Hyperlipidemia Mother   . Cancer Father        lung  . Kidney disease Brother        had a transplant (hemodialysis)  . Stroke Maternal Grandmother   . Heart disease Maternal Grandmother   . Kidney disease Maternal Grandmother   . Prostate cancer Neg Hx    Social History   Substance and Sexual Activity  Alcohol Use No  . Alcohol/week: 0.0 standard drinks     Tobacco Use: High Risk  . Smoking Tobacco Use: Never Smoker  . Smokeless Tobacco Use: Current User    Current Outpatient Medications:  .  cyclobenzaprine (FLEXERIL) 5 MG tablet,  Take 1-2 tablets (5-10 mg total) by mouth at bedtime., Disp: 30 tablet, Rfl: 0 .  ibuprofen (ADVIL) 800 MG tablet, Take 1 tablet (800 mg total) by mouth every 8 (eight) hours as needed for moderate pain., Disp: 90 tablet, Rfl: 0 .  loratadine (CLARITIN) 10 MG tablet, Take 1 tablet (10 mg total) by mouth daily. (Patient not taking: Reported on 08/12/2019), Disp: 30 tablet, Rfl: 11 .  rosuvastatin (CRESTOR) 20 MG tablet, Take 1 tablet by mouth once daily, Disp: 90 tablet, Rfl: 0  No Known Allergies  I personally reviewed active problem list, medication list, allergies, family history, social history, health maintenance with the patient/caregiver today.   ROS  Ten systems reviewed and is negative except as mentioned in HPI   Objective  Today's Vitals   09/19/19 1636  BP: 130/82  Pulse: 88  Resp: 16  Temp: (!) 96.8 F (36 C)  TempSrc: Temporal  SpO2: 99%  Weight: 183 lb 11.2 oz (83.3 kg)  Height: 5\' 10"  (1.778 m)   Body mass index is 26.36 kg/m.   Constitutional: Patient appears well-developed and well-nourished. No distress.  HEENT: head atraumatic, normocephalic, pupils equal and reactive to light Cardiovascular: Normal rate, regular rhythm and normal heart sounds.  No murmur heard. No BLE edema. Pulmonary/Chest: Effort normal and breath  sounds normal. No respiratory distress. Abdominal: Soft.  There is no tenderness. Genitourinary: normal testicular exam, no hernias, patient states lump comes and goes  Psychiatric: Patient has a normal mood and affect. behavior is normal. Judgment and thought content normal.  PHQ2/9: Depression screen [facility] 2/9 09/19/2019 08/12/2019 06/23/2019 05/26/2019 12/19/2018  Decreased Interest 0 0 0 0 0  Down, Depressed, Hopeless 0 0 1 0 0  PHQ - 2 Score 0 0 1 0 0  Altered sleeping 0 0 1 2 2   Tired, decreased energy 0 0 0 0 0  Change in appetite 0 0 1 0 0  Feeling bad or failure about yourself  0 0 0 0 0  Trouble concentrating 0 0 0 2 0  Moving slowly  or fidgety/restless 0 0 2 0 0  Suicidal thoughts 0 0 0 0 0  PHQ-9 Score 0 0 5 4 2   Difficult doing work/chores Not difficult at all - Somewhat difficult Not difficult at all Not difficult at all   PHQ-2/9 Result is negative.    Fall Risk: Fall Risk  09/19/2019 09/19/2019 08/12/2019 06/23/2019 05/26/2019  Falls in the past year? 0 0 0 0 0  Number falls in past yr: 0 0 0 0 0  Injury with Fall? 0 0 0 0 0     Assessment & Plan   1. Acute left-sided low back pain, unspecified whether sciatica present  Discussed no lumps during exam, pain on testis may be from referred back pain since seems to radiate from back to testes and intermittent, he states lump seems to come and go and not necessarily with back pain. Gave reassurance, advised to continue prn meloxicam and muscle relaxer when needed

## 2019-09-23 ENCOUNTER — Encounter: Payer: Self-pay | Admitting: Family Medicine

## 2020-01-10 ENCOUNTER — Ambulatory Visit: Payer: BC Managed Care – PPO | Attending: Internal Medicine

## 2020-01-10 ENCOUNTER — Other Ambulatory Visit: Payer: Self-pay

## 2020-01-10 DIAGNOSIS — Z23 Encounter for immunization: Secondary | ICD-10-CM

## 2020-01-10 NOTE — Progress Notes (Signed)
   Covid-19 Vaccination Clinic  Name:  OSAMU OLGUIN    MRN: [id number] DOB: Aug 19, 1975  01/10/2020  Mr. Boehle was observed post Covid-19 immunization for 15 minutes without incident. He was provided with Vaccine Information Sheet and instruction to access the V-Safe system.   Mr. Goeken was instructed to call 911 with any severe reactions post vaccine: Marland Kitchen Difficulty breathing  . Swelling of face and throat  . A fast heartbeat  . A bad rash all over body  . Dizziness and weakness   Immunizations Administered    Name Date Dose VIS Date Route   Pfizer COVID-19 Vaccine 01/10/2020 11:30 AM 0.3 mL 10/01/2018 Intramuscular   Manufacturer: [employer], [employer]   Lot: [id number]   NDC: [address]-8

## 2020-01-31 ENCOUNTER — Ambulatory Visit: Payer: BC Managed Care – PPO | Attending: Internal Medicine

## 2020-01-31 DIAGNOSIS — Z23 Encounter for immunization: Secondary | ICD-10-CM

## 2020-01-31 NOTE — Progress Notes (Signed)
   Covid-19 Vaccination Clinic  Name:  Nicolas White    MRN: [id number] DOB: 05/23/1976  01/31/2020  Nicolas White was observed post Covid-19 immunization for 15 minutes without incident. He was provided with Vaccine Information Sheet and instruction to access the V-Safe system.   Nicolas White was instructed to call 911 with any severe reactions post vaccine: Marland Kitchen Difficulty breathing  . Swelling of face and throat  . A fast heartbeat  . A bad rash all over body  . Dizziness and weakness   Immunizations Administered    Name Date Dose VIS Date Route   Pfizer COVID-19 Vaccine 01/31/2020 11:34 AM 0.3 mL 10/01/2018 Intramuscular   Manufacturer: [employer], [employer]   Lot: [id number]   NDC: [address]-6

## 2020-02-10 ENCOUNTER — Ambulatory Visit: Payer: BC Managed Care – PPO | Admitting: Family Medicine

## 2020-02-10 ENCOUNTER — Other Ambulatory Visit: Payer: Self-pay

## 2020-02-10 ENCOUNTER — Encounter: Payer: Self-pay | Admitting: Family Medicine

## 2020-02-10 VITALS — BP 136/78 | HR 90 | Temp 97.8°F | Resp 16 | Ht 70.0 in | Wt 179.7 lb

## 2020-02-10 DIAGNOSIS — F419 Anxiety disorder, unspecified: Secondary | ICD-10-CM | POA: Diagnosis not present

## 2020-02-10 DIAGNOSIS — E7800 Pure hypercholesterolemia, unspecified: Secondary | ICD-10-CM

## 2020-02-10 DIAGNOSIS — G44209 Tension-type headache, unspecified, not intractable: Secondary | ICD-10-CM | POA: Diagnosis not present

## 2020-02-10 DIAGNOSIS — F329 Major depressive disorder, single episode, unspecified: Secondary | ICD-10-CM

## 2020-02-10 DIAGNOSIS — M545 Low back pain, unspecified: Secondary | ICD-10-CM

## 2020-02-10 DIAGNOSIS — Z630 Problems in relationship with spouse or partner: Secondary | ICD-10-CM | POA: Diagnosis not present

## 2020-02-10 DIAGNOSIS — R454 Irritability and anger: Secondary | ICD-10-CM | POA: Diagnosis not present

## 2020-02-10 DIAGNOSIS — F99 Mental disorder, not otherwise specified: Secondary | ICD-10-CM

## 2020-02-10 DIAGNOSIS — F5105 Insomnia due to other mental disorder: Secondary | ICD-10-CM

## 2020-02-10 DIAGNOSIS — F32A Depression, unspecified: Secondary | ICD-10-CM

## 2020-02-10 MED ORDER — IBUPROFEN 800 MG PO TABS: 800.0000 mg | ORAL_TABLET | Freq: Three times a day (TID) | ORAL | 0 refills | Status: DC | PRN

## 2020-02-10 MED ORDER — ROSUVASTATIN CALCIUM 20 MG PO TABS: 20.0000 mg | ORAL_TABLET | Freq: Every day | ORAL | 1 refills | Status: DC

## 2020-02-10 MED ORDER — CYCLOBENZAPRINE HCL 5 MG PO TABS: 5.0000 mg | ORAL_TABLET | Freq: Every day | ORAL | 0 refills | Status: DC

## 2020-02-10 MED ORDER — HYDROXYZINE HCL 10 MG PO TABS: 10.0000 mg | ORAL_TABLET | Freq: Every evening | ORAL | 0 refills | Status: DC

## 2020-02-10 NOTE — Addendum Note (Signed)
Addended by: Alba Cory F on: 02/10/2020 03:31 PM   Modules accepted: Orders, Level of Service

## 2020-02-10 NOTE — Progress Notes (Addendum)
Name: Nicolas White   MRN: [id number]    DOB: 1975-08-15   Date:02/10/2020       Progress Note  Subjective  Chief Complaint  Chief Complaint  Patient presents with  . Headache    HPI  Marital problems/Anger problems: he states he cheated on his wife 4 years ago, only once , wife found out and they broke up. She states since than their relationship has not be normal. He wants to talk about it, she gets frustrated and says she has changed because she cannot trust him anymore. He wants the relationship to get better. He has difficulty sleeping because his thoughts at night.   Headaches: he states he has noticed some tension on his forehead, throbbing , around his head, mild , usually when upset or tired. Advised to increase fluids and avoid nsaid's. Advised to take cyclobenzaprine prn   Intermittent low back pain: needs refills of ibuprofen and flexeril. He states has intermittent stiffness, sometimes on left upper back.   Hyperlipidemia: taking crestor, we will recheck labs this Fall, denies side effects    Patient Active Problem List   Diagnosis Date Noted  . OSA (obstructive sleep apnea) 05/27/2019  . Erythrocytosis 12/18/2017  . Hyperlipidemia 08/27/2015  . Displacement of cervical intervertebral disc without myelopathy 08/26/2015  . Calculus of kidney 08/26/2015  . Cervical radiculitis 08/26/2015  . Hypertrophy of tongue papillae 08/26/2015  . Allergic rhinitis, seasonal 10/22/2008    History reviewed. No pertinent surgical history.  Family History  Problem Relation Age of Onset  . Arthritis Mother   . Hypertension Mother   . Heart attack Mother        hx of light heart attack  . Hyperlipidemia Mother   . Cancer Father        lung  . Kidney disease Brother        had a transplant (hemodialysis)  . Stroke Maternal Grandmother   . Heart disease Maternal Grandmother   . Kidney disease Maternal Grandmother   . Prostate cancer Neg Hx     Social History   Tobacco  Use  . Smoking status: Never Smoker  . Smokeless tobacco: Current User    Types: Chew  . Tobacco comment: patient has chewed tobacco for 25 years  Substance Use Topics  . Alcohol use: No    Alcohol/week: 0.0 standard drinks     Current Outpatient Medications:  .  cyclobenzaprine (FLEXERIL) 5 MG tablet, Take 1-2 tablets (5-10 mg total) by mouth at bedtime., Disp: 30 tablet, Rfl: 0 .  ibuprofen (ADVIL) 800 MG tablet, Take 1 tablet (800 mg total) by mouth every 8 (eight) hours as needed for moderate pain., Disp: 90 tablet, Rfl: 0 .  rosuvastatin (CRESTOR) 20 MG tablet, Take 1 tablet by mouth once daily, Disp: 90 tablet, Rfl: 0 .  loratadine (CLARITIN) 10 MG tablet, Take 1 tablet (10 mg total) by mouth daily. (Patient not taking: Reported on 08/12/2019), Disp: 30 tablet, Rfl: 11 .  triamcinolone (KENALOG) 0.1 % paste, APPLY TO AFFECTED AREA TWICE DAILY (Patient not taking: Reported on 02/10/2020), Disp: , Rfl:   No Known Allergies  I personally reviewed active problem list, medication list, allergies, family history, social history, health maintenance with the patient/caregiver today.   ROS  Constitutional: Negative for fever or weight change.  Respiratory: Negative for cough and shortness of breath.   Cardiovascular: Negative for chest pain or palpitations.  Gastrointestinal: Negative for abdominal pain, no bowel changes.  Musculoskeletal: Negative for  gait problem or joint swelling.  Skin: Negative for rash.  Neurological: Negative for dizziness , positive for intermittent headache.  No other specific complaints in a complete review of systems (except as listed in HPI above).  Objective  Vitals:   02/10/20 1440  BP: 136/78  Pulse: 90  Resp: 16  Temp: 97.8 F (36.6 C)  TempSrc: Temporal  SpO2: 98%  Weight: 179 lb 11.2 oz (81.5 kg)  Height: 5\' 10"  (1.778 m)    Body mass index is 25.78 kg/m.  Physical Exam  Constitutional: Patient appears well-developed and  well-nourished. Overweight.  No distress.  HEENT: head atraumatic, normocephalic, pupils equal and reactive to light,neck supple Cardiovascular: Normal rate, regular rhythm and normal heart sounds.  No murmur heard. No BLE edema. Pulmonary/Chest: Effort normal and breath sounds normal. No respiratory distress. Abdominal: Soft.  There is no tenderness. Psychiatric: Patient has a normal mood and affect. behavior is normal. Judgment and thought content normal.  PHQ2/9: Depression screen [facility] 2/9 02/10/2020 09/19/2019 08/12/2019 06/23/2019 05/26/2019  Decreased Interest 1 0 0 0 0  Down, Depressed, Hopeless 1 0 0 1 0  PHQ - 2 Score 2 0 0 1 0  Altered sleeping 0 0 0 1 2  Tired, decreased energy 0 0 0 0 0  Change in appetite 0 0 0 1 0  Feeling bad or failure about yourself  0 0 0 0 0  Trouble concentrating 1 0 0 0 2  Moving slowly or fidgety/restless 2 0 0 2 0  Suicidal thoughts 0 0 0 0 0  PHQ-9 Score 5 0 0 5 4  Difficult doing work/chores Somewhat difficult Not difficult at all - Somewhat difficult Not difficult at all    phq 9 is positive   Fall Risk: Fall Risk  02/10/2020 09/19/2019 09/19/2019 08/12/2019 06/23/2019  Falls in the past year? 0 0 0 0 0  Number falls in past yr: 0 0 0 0 0  Injury with Fall? 0 0 0 0 0    Assessment & Plan   1. Anxiety and depression  He would like to discussed it with counselor, did not like medication in the past  2. Marital problems  Advised marital counseling   3. Anger reaction  Still gets frustrated   4. Tension headache  Advised muscle relaxer prn, avoid nsaid's   5. Insomnia due to other mental disorder  - hydrOXYzine (ATARAX/VISTARIL) 10 MG tablet; Take 1-2 tablets (10-20 mg total) by mouth at bedtime.  Dispense: 30 tablet; Refill: 0  6. Intermittent low back pain  - ibuprofen (ADVIL) 800 MG tablet; Take 1 tablet (800 mg total) by mouth every 8 (eight) hours as needed for moderate pain.  Dispense: 90 tablet; Refill: 0 - cyclobenzaprine  (FLEXERIL) 5 MG tablet; Take 1-2 tablets (5-10 mg total) by mouth at bedtime.  Dispense: 90 tablet; Refill: 0

## 2020-02-10 NOTE — Patient Instructions (Signed)
  Please contact your insurance to find out who you can see as a therapist in town.  [facility] is a good center.

## 2020-04-21 ENCOUNTER — Other Ambulatory Visit: Payer: BC Managed Care – PPO

## 2020-06-23 NOTE — Progress Notes (Signed)
Name: Nicolas White   MRN: [id number]    DOB: 10/27/1975   Date:06/24/2020       Progress Note  Subjective  Chief Complaint  Annual Exam  HPI  Patient presents for annual CPE and follow up  Marital problems/Anger problems: he states he cheated on his wife 4 years ago, only once , wife found out and they broke up. She states since than their relationship has not been normal. He took hydroxizine for sleep for a period of time but doing better, communicating better with his wife  Headaches: he states he has noticed some tension on his forehead, throbbing , around his head, mild , usually when upset or tired. He states doing better he takes Ibuprofen   Intermittent low back pain: he still has medications at home  He states has intermittent stiffness, sometimes on left upper back but is seldom   Hyperlipidemia: taking crestor, we will recheck labs today, no myalgia.    IPSS Questionnaire (AUA-7): Over the past month.   1)  How often have you had a sensation of not emptying your bladder completely after you finish urinating?  0 - Not at all  2)  How often have you had to urinate again less than two hours after you finished urinating? 0 - Not at all  3)  How often have you found you stopped and started again several times when you urinated?  0 - Not at all  4) How difficult have you found it to postpone urination?  0 - Not at all  5) How often have you had a weak urinary stream?  0 - Not at all  6) How often have you had to push or strain to begin urination?  0 - Not at all  7) How many times did you most typically get up to urinate from the time you went to bed until the time you got up in the morning?  0 - None  Total score:  0-7 mildly symptomatic   8-19 moderately symptomatic   20-35 severely symptomatic     Diet: he eats out frequently, discussed packing lunch  Exercise:   Depression: phq 9 is negative Depression screen [facility] 2/9 06/24/2020 02/10/2020 09/19/2019 08/12/2019  06/23/2019  Decreased Interest 0 1 0 0 0  Down, Depressed, Hopeless 0 1 0 0 1  PHQ - 2 Score 0 2 0 0 1  Altered sleeping 0 0 0 0 1  Tired, decreased energy 1 0 0 0 0  Change in appetite 0 0 0 0 1  Feeling bad or failure about yourself  0 0 0 0 0  Trouble concentrating 0 1 0 0 0  Moving slowly or fidgety/restless 0 2 0 0 2  Suicidal thoughts 0 0 0 0 0  PHQ-9 Score 1 5 0 0 5  Difficult doing work/chores Not difficult at all Somewhat difficult Not difficult at all - Somewhat difficult    Hypertension:  BP Readings from Last 3 Encounters:  06/24/20 132/76  02/10/20 136/78  09/19/19 130/82    Obesity: Wt Readings from Last 3 Encounters:  06/24/20 179 lb 14.4 oz (81.6 kg)  02/10/20 179 lb 11.2 oz (81.5 kg)  09/19/19 183 lb 11.2 oz (83.3 kg)   BMI Readings from Last 3 Encounters:  06/24/20 25.81 kg/m  02/10/20 25.78 kg/m  09/19/19 26.36 kg/m     Lipids:  Lab Results  Component Value Date   CHOL 129 05/26/2019   CHOL 138 06/18/2018   CHOL  181 03/13/2018   Lab Results  Component Value Date   HDL 46 05/26/2019   HDL 34 (L) 06/18/2018   HDL 40 (L) 03/13/2018   Lab Results  Component Value Date   LDLCALC 70 05/26/2019   LDLCALC 74 06/18/2018   LDLCALC 113 (H) 03/13/2018   Lab Results  Component Value Date   TRIG 51 05/26/2019   TRIG 201 (H) 06/18/2018   TRIG 162 (H) 03/13/2018   Lab Results  Component Value Date   CHOLHDL 2.8 05/26/2019   CHOLHDL 4.1 06/18/2018   CHOLHDL 4.5 03/13/2018   No results found for: LDLDIRECT Glucose:  Glucose, Bld  Date Value Ref Range Status  05/26/2019 82 65 - 99 mg/dL Final    Comment:    .            Fasting reference interval .   06/18/2018 96 65 - 139 mg/dL Final    Comment:    .        Non-fasting reference interval .   03/13/2018 95 65 - 139 mg/dL Final    Comment:    .        Non-fasting reference interval .       [facility]  AUDIT-C Score 0        Married STD testing and prevention (HIV/chl/gon/syphilis): not interested  Hep C: Ordered at today's visit  Skin cancer: Discussed monitoring for atypical lesions Colorectal cancer: N/A Prostate cancer: N/A   ECG:  05/26/2019  Vaccines:   Flu and Tdap : today   Advanced Care Planning: A voluntary discussion about advance care planning including the explanation and discussion of advance directives.  Discussed health care proxy and Living will, and the patient was able to identify a health care proxy as wife .  Patient does not have a living will at present time.  Patient Active Problem List   Diagnosis Date Noted  . OSA (obstructive sleep apnea) 05/27/2019  . Erythrocytosis 12/18/2017  . Hyperlipidemia 08/27/2015  . Displacement of cervical intervertebral disc without myelopathy 08/26/2015  . Calculus of kidney 08/26/2015  . Cervical radiculitis 08/26/2015  . Hypertrophy of tongue papillae 08/26/2015  . Allergic rhinitis, seasonal 10/22/2008    History reviewed. No pertinent surgical history.  Family History  Problem Relation Age of Onset  . Arthritis Mother   . Hypertension Mother   . Heart attack Mother        hx of light heart attack  . Hyperlipidemia Mother   . Cancer Father        lung  . Kidney disease Brother        had a transplant (hemodialysis)  . Stroke Maternal Grandmother   . Heart disease Maternal Grandmother   . Kidney disease Maternal Grandmother   . Prostate cancer Neg Hx     Social History   Socioeconomic History  . Marital status: Married    Spouse name: Byrd HesselbachMaria   . Number of children: 3  . Years of education: Not on file  . Highest education level: Some college, no degree  Occupational History  . Occupation: Market researcherwearhouse worker   Tobacco Use  . Smoking status: Never Smoker  . Smokeless tobacco: Current User    Types: Chew  . Tobacco comment: patient has chewed tobacco for 25 years  Vaping Use  . Vaping Use: Never used  Substance  and Sexual Activity  . Alcohol use: No    Alcohol/week: 0.0 standard drinks  .  Drug use: No  . Sexual activity: Yes    Partners: Female  Other Topics Concern  . Not on file  Social History Narrative  . Not on file   Social Determinants of Health   Financial Resource Strain: Low Risk   . Difficulty of Paying Living Expenses: Not hard at all  Food Insecurity: No Food Insecurity  . Worried About Programme researcher, broadcasting/film/video in the Last Year: Never true  . Ran Out of Food in the Last Year: Never true  Transportation Needs: No Transportation Needs  . Lack of Transportation (Medical): No  . Lack of Transportation (Non-Medical): No  Physical Activity: Inactive  . Days of Exercise per Week: 0 days  . Minutes of Exercise per Session: 0 min  Stress: No Stress Concern Present  . Feeling of Stress : Only a little  Social Connections: Moderately Isolated  . Frequency of Communication with Friends and Family: Three times a week  . Frequency of Social Gatherings with Friends and Family: Once a week  . Attends Religious Services: Never  . Active Member of Clubs or Organizations: No  . Attends Banker Meetings: Never  . Marital Status: Married  Catering manager Violence: Not At Risk  . Fear of Current or Ex-Partner: No  . Emotionally Abused: No  . Physically Abused: No  . Sexually Abused: No     Current Outpatient Medications:  .  cyclobenzaprine (FLEXERIL) 5 MG tablet, Take 1-2 tablets (5-10 mg total) by mouth at bedtime., Disp: 90 tablet, Rfl: 0 .  hydrOXYzine (ATARAX/VISTARIL) 10 MG tablet, Take 1-2 tablets (10-20 mg total) by mouth at bedtime., Disp: 30 tablet, Rfl: 0 .  ibuprofen (ADVIL) 800 MG tablet, Take 1 tablet (800 mg total) by mouth every 8 (eight) hours as needed for moderate pain., Disp: 90 tablet, Rfl: 0 .  rosuvastatin (CRESTOR) 20 MG tablet, Take 1 tablet (20 mg total) by mouth daily., Disp: 90 tablet, Rfl: 1  No Known Allergies   ROS  Constitutional: Negative  for fever or weight change.  Respiratory: Negative for cough and shortness of breath.   Cardiovascular: Negative for chest pain or palpitations.  Gastrointestinal: Negative for abdominal pain, no bowel changes.  Musculoskeletal: Negative for gait problem or joint swelling.  Skin: Negative for rash.  Neurological: Negative for dizziness or headache.  No other specific complaints in a complete review of systems (except as listed in HPI above).   Objective  Vitals:   06/24/20 0856  BP: 132/76  Pulse: 71  Resp: 16  Temp: 98.1 F (36.7 C)  TempSrc: Oral  SpO2: 97%  Weight: 179 lb 14.4 oz (81.6 kg)  Height: 5\' 10"  (1.778 m)    Body mass index is 25.81 kg/m.  Physical Exam  Constitutional: Patient appears well-developed and well-nourished. No distress.  HENT: Head: Normocephalic and atraumatic. Ears: B TMs ok, no erythema or effusion; Nose: not done Mouth/Throat: not done Eyes: Conjunctivae and EOM are normal. Pupils are equal, round, and reactive to light. No scleral icterus.  Neck: Normal range of motion. Neck supple. No JVD present. No thyromegaly present.  Cardiovascular: Normal rate, regular rhythm and normal heart sounds.  No murmur heard. No BLE edema. Pulmonary/Chest: Effort normal and breath sounds normal. No respiratory distress. Abdominal: Soft. Bowel sounds are normal, no distension. There is no tenderness. no masses MALE GENITALIA: Normal descended testes bilaterally, no masses palpated, no hernias, no lesions, no discharge RECTAL: not done  Musculoskeletal: Normal range of motion, no joint  effusions. No gross deformities Neurological: he is alert and oriented to person, place, and time. No cranial nerve deficit. Coordination, balance, strength, speech and gait are normal.  Skin: Skin is warm and dry. No rash noted. No erythema.  Psychiatric: Patient has a normal mood and affect. behavior is normal. Judgment and thought content normal.   Fall Risk: Fall Risk   06/24/2020 02/10/2020 09/19/2019 09/19/2019 08/12/2019  Falls in the past year? 0 0 0 0 0  Number falls in past yr: 0 0 0 0 0  Injury with Fall? 0 0 0 0 0      Functional Status Survey: Is the patient deaf or have difficulty hearing?: No Does the patient have difficulty seeing, even when wearing glasses/contacts?: No Does the patient have difficulty concentrating, remembering, or making decisions?: No Does the patient have difficulty walking or climbing stairs?: No Does the patient have difficulty dressing or bathing?: No Does the patient have difficulty doing errands alone such as visiting a doctor's office or shopping?: No    Assessment & Plan  1. Pure hypercholesterolemia  - rosuvastatin (CRESTOR) 20 MG tablet; Take 1 tablet (20 mg total) by mouth daily.  Dispense: 90 tablet; Refill: 1 - Lipid panel - COMPLETE METABOLIC PANEL WITH GFR  2. Need for hepatitis C screening test  - Hepatitis C Antibody  3. Need for diphtheria-tetanus-pertussis (Tdap) vaccine  - Tdap vaccine greater than or equal to 7yo IM  4. OSA (obstructive sleep apnea)  - CBC with Differential/Platelet  5. Need for immunization against influenza  - Flu Vaccine QUAD 36+ mos IM  6. Intermittent low back pain   7. Pre-diabetes  - Hemoglobin A1c  8. Well adult exam   9. Anxiety and depression  - hydrOXYzine (ATARAX/VISTARIL) 10 MG tablet; Take 1-2 tablets (10-20 mg total) by mouth at bedtime.  Dispense: 30 tablet; Refill: 0  10. Insomnia due to other mental disorder  - hydrOXYzine (ATARAX/VISTARIL) 10 MG tablet; Take 1-2 tablets (10-20 mg total) by mouth at bedtime.  Dispense: 30 tablet; Refill: 0   -Prostate cancer screening and PSA options (with potential risks and benefits of testing vs not testing) were discussed along with recent recs/guidelines. -USPSTF grade A and B recommendations reviewed with patient; age-appropriate recommendations, preventive care, screening tests, etc discussed and  encouraged; healthy living encouraged; see AVS for patient education given to patient -Discussed importance of 150 minutes of physical activity weekly, eat two servings of fish weekly, eat one serving of tree nuts ( cashews, pistachios, pecans, almonds.Marland Kitchen) every other day, eat 6 servings of fruit/vegetables daily and drink plenty of water and avoid sweet beverages.

## 2020-06-24 ENCOUNTER — Other Ambulatory Visit: Payer: Self-pay | Admitting: Emergency Medicine

## 2020-06-24 ENCOUNTER — Encounter: Payer: Self-pay | Admitting: Family Medicine

## 2020-06-24 ENCOUNTER — Ambulatory Visit (INDEPENDENT_AMBULATORY_CARE_PROVIDER_SITE_OTHER): Payer: BC Managed Care – PPO | Admitting: Family Medicine

## 2020-06-24 ENCOUNTER — Other Ambulatory Visit: Payer: Self-pay

## 2020-06-24 VITALS — BP 132/76 | HR 71 | Temp 98.1°F | Resp 16 | Ht 70.0 in | Wt 179.9 lb

## 2020-06-24 DIAGNOSIS — G4733 Obstructive sleep apnea (adult) (pediatric): Secondary | ICD-10-CM | POA: Diagnosis not present

## 2020-06-24 DIAGNOSIS — Z1211 Encounter for screening for malignant neoplasm of colon: Secondary | ICD-10-CM

## 2020-06-24 DIAGNOSIS — Z23 Encounter for immunization: Secondary | ICD-10-CM

## 2020-06-24 DIAGNOSIS — Z1159 Encounter for screening for other viral diseases: Secondary | ICD-10-CM | POA: Diagnosis not present

## 2020-06-24 DIAGNOSIS — Z0000 Encounter for general adult medical examination without abnormal findings: Secondary | ICD-10-CM

## 2020-06-24 DIAGNOSIS — F419 Anxiety disorder, unspecified: Secondary | ICD-10-CM

## 2020-06-24 DIAGNOSIS — E7800 Pure hypercholesterolemia, unspecified: Secondary | ICD-10-CM

## 2020-06-24 DIAGNOSIS — F32A Depression, unspecified: Secondary | ICD-10-CM

## 2020-06-24 DIAGNOSIS — M545 Low back pain, unspecified: Secondary | ICD-10-CM

## 2020-06-24 DIAGNOSIS — R7303 Prediabetes: Secondary | ICD-10-CM

## 2020-06-24 DIAGNOSIS — F99 Mental disorder, not otherwise specified: Secondary | ICD-10-CM

## 2020-06-24 DIAGNOSIS — F5105 Insomnia due to other mental disorder: Secondary | ICD-10-CM

## 2020-06-24 MED ORDER — ROSUVASTATIN CALCIUM 20 MG PO TABS: 20.0000 mg | ORAL_TABLET | Freq: Every day | ORAL | 1 refills | Status: DC

## 2020-06-24 MED ORDER — HYDROXYZINE HCL 10 MG PO TABS: 10.0000 mg | ORAL_TABLET | Freq: Every evening | ORAL | 0 refills | Status: DC

## 2020-06-24 NOTE — Patient Instructions (Signed)
Preventive Care 19-45 Years Old, Male Preventive care refers to lifestyle choices and visits with your health care provider that can promote health and wellness. This includes:  A yearly physical exam. This is also called an annual well check.  Regular dental and eye exams.  Immunizations.  Screening for certain conditions.  Healthy lifestyle choices, such as eating a healthy diet, getting regular exercise, not using drugs or products that contain nicotine and tobacco, and limiting alcohol use. What can I expect for my preventive care visit? Physical exam Your health care provider will check:  Height and weight. These may be used to calculate body mass index (BMI), which is a measurement that tells if you are at a healthy weight.  Heart rate and blood pressure.  Your skin for abnormal spots. Counseling Your health care provider may ask you questions about:  Alcohol, tobacco, and drug use.  Emotional well-being.  Home and relationship well-being.  Sexual activity.  Eating habits.  Work and work Statistician. What immunizations do I need?  Influenza (flu) vaccine  This is recommended every year. Tetanus, diphtheria, and pertussis (Tdap) vaccine  You may need a Td booster every 10 years. Varicella (chickenpox) vaccine  You may need this vaccine if you have not already been vaccinated. Human papillomavirus (HPV) vaccine  If recommended by your health care provider, you may need three doses over 6 months. Measles, mumps, and rubella (MMR) vaccine  You may need at least one dose of MMR. You may also need a second dose. Meningococcal conjugate (MenACWY) vaccine  One dose is recommended if you are 45-76 years old and a Market researcher living in a residence hall, or if you have one of several medical conditions. You may also need additional booster doses. Pneumococcal conjugate (PCV13) vaccine  You may need this if you have certain conditions and were not  previously vaccinated. Pneumococcal polysaccharide (PPSV23) vaccine  You may need one or two doses if you smoke cigarettes or if you have certain conditions. Hepatitis A vaccine  You may need this if you have certain conditions or if you travel or work in places where you may be exposed to hepatitis A. Hepatitis B vaccine  You may need this if you have certain conditions or if you travel or work in places where you may be exposed to hepatitis B. Haemophilus influenzae type b (Hib) vaccine  You may need this if you have certain risk factors. You may receive vaccines as individual doses or as more than one vaccine together in one shot (combination vaccines). Talk with your health care provider about the risks and benefits of combination vaccines. What tests do I need? Blood tests  Lipid and cholesterol levels. These may be checked every 5 years starting at age 17.  Hepatitis C test.  Hepatitis B test. Screening   Diabetes screening. This is done by checking your blood sugar (glucose) after you have not eaten for a while (fasting).  Sexually transmitted disease (STD) testing. Talk with your health care provider about your test results, treatment options, and if necessary, the need for more tests. Follow these instructions at home: Eating and drinking   Eat a diet that includes fresh fruits and vegetables, whole grains, lean protein, and low-fat dairy products.  Take vitamin and mineral supplements as recommended by your health care provider.  Do not drink alcohol if your health care provider tells you not to drink.  If you drink alcohol: ? Limit how much you have to 0-2  drinks a day. ? Be aware of how much alcohol is in your drink. In the [place] one drink equals one 12 oz bottle of beer (355 mL), one 5 oz glass of wine (148 mL), or one 1 oz glass of hard liquor (44 mL). Lifestyle  Take daily care of your teeth and gums.  Stay active. Exercise for at least 30 minutes on 5 or  more days each week.  Do not use any products that contain nicotine or tobacco, such as cigarettes, e-cigarettes, and chewing tobacco. If you need help quitting, ask your health care provider.  If you are sexually active, practice safe sex. Use a condom or other form of protection to prevent STIs (sexually transmitted infections). What's next?  Go to your health care provider once a year for a well check visit.  Ask your health care provider how often you should have your eyes and teeth checked.  Stay up to date on all vaccines. This information is not intended to replace advice given to you by your health care provider. Make sure you discuss any questions you have with your health care provider. Document Revised: 07/18/2018 Document Reviewed: 07/18/2018 Elsevier Patient Education  2020 [employer].

## 2020-06-25 LAB — CBC WITH DIFFERENTIAL/PLATELET
Absolute Monocytes: 512 cells/uL (ref 200–950)
Basophils Absolute: 28 cells/uL (ref 0–200)
Basophils Relative: 0.6 %
Eosinophils Absolute: 61 cells/uL (ref 15–500)
Eosinophils Relative: 1.3 %
HCT: 53.7 % — ABNORMAL HIGH (ref 38.5–50.0)
Hemoglobin: 18.7 g/dL — ABNORMAL HIGH (ref 13.2–17.1)
Lymphs Abs: 1847 cells/uL (ref 850–3900)
MCH: 30.7 pg (ref 27.0–33.0)
MCHC: 34.8 g/dL (ref 32.0–36.0)
MCV: 88 fL (ref 80.0–100.0)
MPV: 10.4 fL (ref 7.5–12.5)
Monocytes Relative: 10.9 %
Neutro Abs: 2251 cells/uL (ref 1500–7800)
Neutrophils Relative %: 47.9 %
Platelets: 219 10*3/uL (ref 140–400)
RBC: 6.1 10*6/uL — ABNORMAL HIGH (ref 4.20–5.80)
RDW: 13.2 % (ref 11.0–15.0)
Total Lymphocyte: 39.3 %
WBC: 4.7 10*3/uL (ref 3.8–10.8)

## 2020-06-25 LAB — LIPID PANEL
Cholesterol: 153 mg/dL (ref <200)
HDL: 41 mg/dL (ref >=40)
LDL Cholesterol (Calc): 92 mg/dL (calc)
Non-HDL Cholesterol (Calc): 112 mg/dL (calc) (ref <130)
Total CHOL/HDL Ratio: 3.7 (calc) (ref <5.0)
Triglycerides: 103 mg/dL (ref <150)

## 2020-06-25 LAB — HEMOGLOBIN A1C
Hgb A1c MFr Bld: 6 % of total Hgb — ABNORMAL HIGH (ref <5.7)
Mean Plasma Glucose: 126 (calc)
eAG (mmol/L): 7 (calc)

## 2020-06-25 LAB — COMPLETE METABOLIC PANEL WITH GFR
AG Ratio: 1.6 (calc) (ref 1.0–2.5)
ALT: 32 U/L (ref 9–46)
AST: 26 U/L (ref 10–40)
Albumin: 4.8 g/dL (ref 3.6–5.1)
Alkaline phosphatase (APISO): 74 U/L (ref 36–130)
BUN: 16 mg/dL (ref 7–25)
CO2: 28 mmol/L (ref 20–32)
Calcium: 9.9 mg/dL (ref 8.6–10.3)
Chloride: 102 mmol/L (ref 98–110)
Creat: 1.02 mg/dL (ref 0.60–1.35)
GFR, Est African American: 102 mL/min/{1.73_m2} (ref >=60)
GFR, Est Non African American: 88 mL/min/{1.73_m2} (ref >=60)
Globulin: 3 g/dL (calc) (ref 1.9–3.7)
Glucose, Bld: 86 mg/dL (ref 65–99)
Potassium: 4.2 mmol/L (ref 3.5–5.3)
Sodium: 140 mmol/L (ref 135–146)
Total Bilirubin: 0.6 mg/dL (ref 0.2–1.2)
Total Protein: 7.8 g/dL (ref 6.1–8.1)

## 2020-06-25 LAB — HEPATITIS C ANTIBODY
Hepatitis C Ab: NONREACTIVE
SIGNAL TO CUT-OFF: 0.3 (ref <1.00)

## 2020-06-30 ENCOUNTER — Telehealth (INDEPENDENT_AMBULATORY_CARE_PROVIDER_SITE_OTHER): Payer: Self-pay | Admitting: Gastroenterology

## 2020-06-30 ENCOUNTER — Other Ambulatory Visit: Payer: Self-pay

## 2020-06-30 DIAGNOSIS — Z1211 Encounter for screening for malignant neoplasm of colon: Secondary | ICD-10-CM

## 2020-06-30 MED ORDER — PEG 3350-KCL-NA BICARB-NACL 420 G PO SOLR: 4000.0000 mL | Freq: Once | ORAL | 0 refills | Status: AC

## 2020-06-30 NOTE — Progress Notes (Signed)
Gastroenterology Pre-Procedure Review  Request Date: 08/17/20 Requesting Physician: Dr. Tobi Bastos  PATIENT REVIEW QUESTIONS: The patient responded to the following health history questions as indicated:    1. Are you having any GI issues? no 2. Do you have a personal history of Polyps? no 3. Do you have a family history of Colon Cancer or Polyps? yes (aunt colon polyps) 4. Diabetes Mellitus? no 5. Joint replacements in the past 12 months?no 6. Major health problems in the past 3 months?no 7. Any artificial heart valves, MVP, or defibrillator?no    MEDICATIONS & ALLERGIES:    Patient reports the following regarding taking any anticoagulation/antiplatelet therapy:   Plavix, Coumadin, Eliquis, Xarelto, Lovenox, Pradaxa, Brilinta, or Effient? no Aspirin? no  Patient confirms/reports the following medications:  Current Outpatient Medications  Medication Sig Dispense Refill  . rosuvastatin (CRESTOR) 20 MG tablet Take 1 tablet (20 mg total) by mouth daily. 90 tablet 1  . cyclobenzaprine (FLEXERIL) 5 MG tablet Take 1-2 tablets (5-10 mg total) by mouth at bedtime. (Patient not taking: Reported on 06/30/2020) 90 tablet 0  . hydrOXYzine (ATARAX/VISTARIL) 10 MG tablet Take 1-2 tablets (10-20 mg total) by mouth at bedtime. (Patient not taking: Reported on 06/30/2020) 30 tablet 0  . ibuprofen (ADVIL) 800 MG tablet Take 1 tablet (800 mg total) by mouth every 8 (eight) hours as needed for moderate pain. (Patient not taking: Reported on 06/30/2020) 90 tablet 0   No current facility-administered medications for this visit.    Patient confirms/reports the following allergies:  No Known Allergies  No orders of the defined types were placed in this encounter.   AUTHORIZATION INFORMATION Primary Insurance: 1D#: Group #:  Secondary Insurance: 1D#: Group #:  SCHEDULE INFORMATION: Date: 08/17/20 Time: Location:[facility]

## 2020-08-09 ENCOUNTER — Other Ambulatory Visit: Payer: Self-pay | Admitting: Family Medicine

## 2020-08-09 ENCOUNTER — Telehealth: Payer: Self-pay | Admitting: Family Medicine

## 2020-08-09 DIAGNOSIS — M545 Low back pain, unspecified: Secondary | ICD-10-CM

## 2020-08-09 MED ORDER — CYCLOBENZAPRINE HCL 5 MG PO TABS: 5.0000 mg | ORAL_TABLET | Freq: Every day | ORAL | 0 refills | Status: DC

## 2020-08-09 MED ORDER — IBUPROFEN 800 MG PO TABS: 800.0000 mg | ORAL_TABLET | Freq: Three times a day (TID) | ORAL | 0 refills | Status: DC | PRN

## 2020-08-09 NOTE — Telephone Encounter (Signed)
Copied from CRM [id number]. Topic: General - Other >> Aug 09, 2020 11:56 AM Marylen Ponto wrote: Reason for CRM: Pt stated the disk in his neck is inflamed and the pain is going down his arm and leg. Pt requests that Dr. Carlynn Purl send in a medication for the inflammation to [facility] [address] [place] - [address]

## 2020-08-13 ENCOUNTER — Other Ambulatory Visit: Admission: RE | Admit: 2020-08-13 | Payer: BC Managed Care – PPO | Source: Ambulatory Visit

## 2020-08-16 ENCOUNTER — Telehealth: Payer: Self-pay | Admitting: Gastroenterology

## 2020-08-16 ENCOUNTER — Telehealth: Payer: Self-pay

## 2020-08-16 NOTE — Telephone Encounter (Signed)
Called patient to verify he has been to take his covid test. No answer. LVM to call office back or call the Endo unit to cancel procedure.

## 2020-08-16 NOTE — Telephone Encounter (Signed)
Spoke with patient he plans to check with his insurance first and then call the office back to reschedule procedure.

## 2020-08-16 NOTE — Telephone Encounter (Signed)
Pt wants to reschedule Colonoscopy.

## 2020-08-16 NOTE — Telephone Encounter (Signed)
Patient has a procedure scheduled for 08/17/2020 but has not went for covid test  

## 2020-08-17 ENCOUNTER — Encounter: Admission: RE | Payer: Self-pay | Source: Home / Self Care

## 2020-08-17 ENCOUNTER — Ambulatory Visit
Admission: RE | Admit: 2020-08-17 | Payer: BC Managed Care – PPO | Source: Home / Self Care | Admitting: Gastroenterology

## 2020-08-17 SURGERY — COLONOSCOPY WITH PROPOFOL
Anesthesia: General

## 2020-09-20 NOTE — Progress Notes (Addendum)
09/21/2020 11:59 AM   Nicolas White May 11, 1976 [id number]  Referring provider: Alba Cory, MD [address] [place],  [place] [address]  Chief Complaint  Patient presents with  . Nephrolithiasis   Urological history: 1. Nephrolithiasis - CT renal stone study 2018 obstructing 4 x 5 mm stone in the right mid ureter with mild proximal hydroureteronephrosis - spontaneously passed   HPI: Nicolas White is a 45 y.o. male who presents today with the complaints of lower left back pain that radiates to the left groin.    He has been experiencing left-sided flank pain that wraps around his waist bilaterally and radiates into the left testicle since July.  He states that he also gets really stiff with the pain.  He has no urinary complaints at this time.  Patient denies any modifying or aggravating factors.  Patient denies any gross hematuria, dysuria or suprapubic/flank pain.  Patient denies any fevers, chills, nausea or vomiting.   UA 3-10 RBC's  He also had questions regarding prostate cancer.   PMH: Past Medical History:  Diagnosis Date  . Allergy   . Hyperlipidemia   . Hypertrophy, tongue, papillae   . OSA (obstructive sleep apnea) 05/27/2019    Surgical History: History reviewed. No pertinent surgical history.  Home Medications:  Allergies as of 09/21/2020   No Known Allergies     Medication List       Accurate as of September 21, 2020 11:59 AM. If you have any questions, ask your nurse or doctor.        cyclobenzaprine 5 MG tablet Commonly known as: FLEXERIL Take 1-2 tablets (5-10 mg total) by mouth at bedtime.   hydrOXYzine 10 MG tablet Commonly known as: ATARAX/VISTARIL Take 1-2 tablets (10-20 mg total) by mouth at bedtime.   ibuprofen 800 MG tablet Commonly known as: ADVIL Take 1 tablet (800 mg total) by mouth every 8 (eight) hours as needed for moderate pain.   rosuvastatin 20 MG tablet Commonly known as: CRESTOR Take 1 tablet  (20 mg total) by mouth daily.       Allergies: No Known Allergies  Family History: Family History  Problem Relation Age of Onset  . Arthritis Mother   . Hypertension Mother   . Heart attack Mother        hx of light heart attack  . Hyperlipidemia Mother   . Cancer Father        lung  . Kidney disease Brother        had a transplant (hemodialysis)  . Stroke Maternal Grandmother   . Heart disease Maternal Grandmother   . Kidney disease Maternal Grandmother   . Prostate cancer Neg Hx     Social History:  reports that he has never smoked. His smokeless tobacco use includes chew. He reports that he does not drink alcohol and does not use drugs.  ROS: Pertinent ROS in HPI  Physical Exam: BP 130/87   Pulse (!) 112   Ht 5\' 10"  (1.778 m)   Wt 184 lb 14.4 oz (83.9 kg)   BMI 26.53 kg/m   Constitutional:  Well nourished. Alert and oriented, No acute distress. HEENT: [place] AT, mask in place.  Trachea midline Cardiovascular: No clubbing, cyanosis, or edema. Respiratory: Normal respiratory effort, no increased work of breathing. Neurologic: Grossly intact, no focal deficits, moving all 4 extremities. Psychiatric: Normal mood and affect.  Laboratory Data: Lab Results  Component Value Date   WBC 4.7 06/24/2020   HGB  18.7 (H) 06/24/2020   HCT 53.7 (H) 06/24/2020   MCV 88.0 06/24/2020   PLT 219 06/24/2020    Lab Results  Component Value Date   CREATININE 1.02 06/24/2020    Lab Results  Component Value Date   HGBA1C 6.0 (H) 06/24/2020    Lab Results  Component Value Date   TSH 2.81 05/26/2019       Component Value Date/Time   CHOL 153 06/24/2020 1004   CHOL 283 (H) 08/26/2015 0915   HDL 41 06/24/2020 1004   HDL 40 08/26/2015 0915   CHOLHDL 3.7 06/24/2020 1004   VLDL 14 05/26/2016 1032   LDLCALC 92 06/24/2020 1004    Lab Results  Component Value Date   AST 26 06/24/2020   Lab Results  Component Value Date   ALT 32 06/24/2020    Urinalysis Component      Latest Ref Rng & Units 09/21/2020  Specific Gravity, UA     1.005 - 1.030 >1.030 (H)  pH, UA     5.0 - 7.5 5.5  Color, UA     Yellow Yellow  Appearance Ur     Clear Clear  Leukocytes,UA     Negative Negative  Protein,UA     Negative/Trace Trace (A)  Glucose, UA     Negative Negative  Ketones, UA     Negative Negative  RBC, UA     Negative 1+ (A)  Bilirubin, UA     Negative Negative  Urobilinogen, Ur     0.2 - 1.0 mg/dL 2.0 (H)  Nitrite, UA     Negative Negative  Microscopic Examination      See below:   Component     Latest Ref Rng & Units 09/21/2020  WBC, UA     0 - 5 /hpf None seen  RBC     0 - 2 /hpf 3-10 (A)  Epithelial Cells (non renal)     0 - 10 /hpf 0-10  Casts     None seen /lpf Present (A)  Cast Type     N/A Hyaline casts  Mucus, UA     Not Estab. Present (A)  Bacteria, UA     None seen/Few Few  I have reviewed the labs.   Pertinent Imaging: CLINICAL DATA:  Left flank pain  EXAM: ABDOMEN - 1 VIEW  COMPARISON:  09/06/2016  FINDINGS: The bowel gas pattern is normal. No radio-opaque calculi or other significant radiographic abnormality are seen.  IMPRESSION: Negative.   Electronically Signed   By: Charlett Nose M.D.   On: 09/22/2020 07:30  I have independently reviewed the films.  See HPI.   Assessment & Plan:       1. Nephrolithiasis - no stones seen on today's KUB - patient with flank pain  2. Left Flank Pain - no stone seen on today's Xray - obtain a CT renal stone study  3. Microscopic hematuria - if CT scan is negative, will need a cysto to complete hematuria work up  4. PSA screening - explained to patient that he is at the age to consider prostate cancer screening as per AUA recommendations are to consider this on an individual basis and he would like to be screened -we will obtain PSA at next visit pending CT results   Return for CT report, PSA and exam .  These notes generated with voice recognition  software. I apologize for typographical errors.  Michiel Cowboy, PA-C  [facility] [address]  [place], [place] [address] [phone]

## 2020-09-21 ENCOUNTER — Ambulatory Visit (INDEPENDENT_AMBULATORY_CARE_PROVIDER_SITE_OTHER): Payer: BC Managed Care – PPO | Admitting: Urology

## 2020-09-21 ENCOUNTER — Other Ambulatory Visit: Payer: Self-pay

## 2020-09-21 ENCOUNTER — Ambulatory Visit
Admission: RE | Admit: 2020-09-21 | Discharge: 2020-09-21 | Disposition: A | Discharge status: Home / Self Care | Payer: BC Managed Care – PPO | Source: Ambulatory Visit | Attending: Urology | Admitting: Urology

## 2020-09-21 ENCOUNTER — Ambulatory Visit
Admission: RE | Admit: 2020-09-21 | Discharge: 2020-09-21 | Disposition: A | Discharge status: Home / Self Care | Payer: BC Managed Care – PPO | Attending: Urology | Admitting: Urology

## 2020-09-21 ENCOUNTER — Encounter: Payer: Self-pay | Admitting: Urology

## 2020-09-21 ENCOUNTER — Other Ambulatory Visit: Payer: Self-pay | Admitting: Family Medicine

## 2020-09-21 VITALS — BP 130/87 | HR 112 | Ht 70.0 in | Wt 184.9 lb

## 2020-09-21 DIAGNOSIS — R3129 Other microscopic hematuria: Secondary | ICD-10-CM | POA: Diagnosis not present

## 2020-09-21 DIAGNOSIS — N200 Calculus of kidney: Secondary | ICD-10-CM | POA: Insufficient documentation

## 2020-09-21 DIAGNOSIS — Z125 Encounter for screening for malignant neoplasm of prostate: Secondary | ICD-10-CM | POA: Diagnosis not present

## 2020-09-21 DIAGNOSIS — R109 Unspecified abdominal pain: Secondary | ICD-10-CM | POA: Diagnosis not present

## 2020-09-21 LAB — URINALYSIS, COMPLETE
Bilirubin, UA: NEGATIVE
Glucose, UA: NEGATIVE
Ketones, UA: NEGATIVE
Leukocytes,UA: NEGATIVE
Nitrite, UA: NEGATIVE
Specific Gravity, UA: >1.03 — ABNORMAL HIGH (ref 1.005–1.030)
Urobilinogen, Ur: 2 mg/dL — ABNORMAL HIGH (ref 0.2–1.0)
pH, UA: 5.5 (ref 5.0–7.5)

## 2020-09-21 LAB — MICROSCOPIC EXAMINATION: WBC, UA: NONE SEEN /hpf (ref 0–5)

## 2020-09-22 ENCOUNTER — Ambulatory Visit: Payer: BC Managed Care – PPO | Admitting: Urology

## 2020-09-22 NOTE — Progress Notes (Signed)
Name: Nicolas White   MRN: [id number]    DOB: July 10, 1976   Date:09/24/2020       Progress Note  Subjective  Chief Complaint  Follow up   HPI  Marital problems/Anger problems: doing much better, sleeping better now, still has hydroxyzine prn only , still has medication at home   Headaches: he states he has noticed some tension on his forehead, throbbing , around his head, mild , usually when upset or tired. He states doing better he takes Ibuprofen. Unchanged    Intermittent low back pain: he still has medications at home  He states has intermittent stiffness,, usually worse at the end of the day when sitting down. He states it can radiate to abdomen or left groin, also feels in the mornings but improves with activity. Pain is not constant, it is usually mild , dull aching about 3/10. No weakness , no bowel or bladder incontinence He has intermittent paresthesia / burning on right foot   Hyperlipidemia: taking crestor, reviewed last labs, no myalgia, continue medications   Hematuria: seeing urologist, going to have CT renal stone done today. He has very mild dysuria, but no  urinary frequency , fever or chills.   OSA: also has increase HCT/Hbg, discussed using CPAP machine, he never went to go get it, explained it will help with his HCT  Pre-diabetes: denies polyphagia, polydipsia or polyuria, last A1C 6 %   Patient Active Problem List   Diagnosis Date Noted  . OSA (obstructive sleep apnea) 05/27/2019  . Erythrocytosis 12/18/2017  . Hyperlipidemia 08/27/2015  . Displacement of cervical intervertebral disc without myelopathy 08/26/2015  . Calculus of kidney 08/26/2015  . Cervical radiculitis 08/26/2015  . Hypertrophy of tongue papillae 08/26/2015  . Allergic rhinitis, seasonal 10/22/2008    No past surgical history on file.  Family History  Problem Relation Age of Onset  . Arthritis Mother   . Hypertension Mother   . Heart attack Mother        hx of light heart attack  .  Hyperlipidemia Mother   . Cancer Father        lung  . Kidney disease Brother        had a transplant (hemodialysis)  . Stroke Maternal Grandmother   . Heart disease Maternal Grandmother   . Kidney disease Maternal Grandmother   . Prostate cancer Neg Hx     Social History   Tobacco Use  . Smoking status: Never Smoker  . Smokeless tobacco: Current User    Types: Chew  . Tobacco comment: patient has chewed tobacco for 25 years  Substance Use Topics  . Alcohol use: No    Alcohol/week: 0.0 standard drinks     Current Outpatient Medications:  .  cyclobenzaprine (FLEXERIL) 5 MG tablet, Take 1-2 tablets (5-10 mg total) by mouth at bedtime., Disp: 90 tablet, Rfl: 0 .  ibuprofen (ADVIL) 800 MG tablet, Take 1 tablet (800 mg total) by mouth every 8 (eight) hours as needed for moderate pain., Disp: 90 tablet, Rfl: 0 .  rosuvastatin (CRESTOR) 20 MG tablet, Take 1 tablet (20 mg total) by mouth daily., Disp: 90 tablet, Rfl: 1 .  hydrOXYzine (ATARAX/VISTARIL) 10 MG tablet, Take 1-2 tablets (10-20 mg total) by mouth at bedtime. (Patient not taking: Reported on 09/24/2020), Disp: 30 tablet, Rfl: 0  No Known Allergies  I personally reviewed active problem list, medication list, allergies, family history, social history, health maintenance with the patient/caregiver today.   ROS  Constitutional: Negative  for fever or weight change.  Respiratory: Negative for cough and shortness of breath.   Cardiovascular: Negative for chest pain or palpitations.  Gastrointestinal: Negative for abdominal pain, no bowel changes.  Musculoskeletal: Negative for gait problem or joint swelling.  Skin: Negative for rash.  Neurological: Negative for dizziness or headache.  No other specific complaints in a complete review of systems (except as listed in HPI above).  Objective  Vitals:   09/24/20 1351  BP: 122/88  Pulse: 79  Resp: 16  Temp: 98.5 F (36.9 C)  TempSrc: Oral  SpO2: 97%  Weight: 184 lb 11.2  oz (83.8 kg)  Height: 5\' 10"  (1.778 m)    Body mass index is 26.5 kg/m.  Physical Exam  Constitutional: Patient appears well-developed and well-nourished.  No distress.  HEENT: head atraumatic, normocephalic, pupils equal and reactive to light, neck supple Cardiovascular: Normal rate, regular rhythm and normal heart sounds.  No murmur heard. No BLE edema. Pulmonary/Chest: Effort normal and breath sounds normal. No respiratory distress. Abdominal: Soft.  There is no tenderness.negative CVA tenderness Muscular skeletal: normal hip exam, normal back exam Psychiatric: Patient has a normal mood and affect. behavior is normal. Judgment and thought content normal.   Recent Results (from the past 2160 hour(s))  Urinalysis, Complete     Status: Abnormal   Collection Time: 09/21/20 11:16 AM  Result Value Ref Range   Specific Gravity, UA >1.030 (H) 1.005 - 1.030   pH, UA 5.5 5.0 - 7.5   Color, UA Yellow Yellow   Appearance Ur Clear Clear   Leukocytes,UA Negative Negative   Protein,UA Trace (A) Negative/Trace   Glucose, UA Negative Negative   Ketones, UA Negative Negative   RBC, UA 1+ (A) Negative   Bilirubin, UA Negative Negative   Urobilinogen, Ur 2.0 (H) 0.2 - 1.0 mg/dL   Nitrite, UA Negative Negative   Microscopic Examination See below:   CULTURE, URINE COMPREHENSIVE     Status: None (Preliminary result)   Collection Time: 09/21/20 11:16 AM   Specimen: Urine   UR  Result Value Ref Range   Urine Culture, Comprehensive Preliminary report    Organism ID, Bacteria Comment     Comment: No growth after 18-24 hours.  Microscopic Examination     Status: Abnormal   Collection Time: 09/21/20 11:16 AM   Urine  Result Value Ref Range   WBC, UA None seen 0 - 5 /hpf   RBC 3-10 (A) 0 - 2 /hpf   Epithelial Cells (non renal) 0-10 0 - 10 /hpf   Casts Present (A) None seen /lpf   Cast Type Hyaline casts N/A   Mucus, UA Present (A) Not Estab.   Bacteria, UA Few None seen/Few      PHQ2/9: Depression screen [facility] 2/9 09/24/2020 06/24/2020 02/10/2020 09/19/2019 08/12/2019  Decreased Interest 0 0 1 0 0  Down, Depressed, Hopeless 0 0 1 0 0  PHQ - 2 Score 0 0 2 0 0  Altered sleeping - 0 0 0 0  Tired, decreased energy - 1 0 0 0  Change in appetite - 0 0 0 0  Feeling bad or failure about yourself  - 0 0 0 0  Trouble concentrating - 0 1 0 0  Moving slowly or fidgety/restless - 0 2 0 0  Suicidal thoughts - 0 0 0 0  PHQ-9 Score - 1 5 0 0  Difficult doing work/chores - Not difficult at all Somewhat difficult Not difficult at all -  Some recent  data might be hidden    phq 9 is negative   Fall Risk: Fall Risk  09/24/2020 06/24/2020 02/10/2020 09/19/2019 09/19/2019  Falls in the past year? 0 0 0 0 0  Number falls in past yr: 0 0 0 0 0  Injury with Fall? 0 0 0 0 0     Functional Status Survey: Is the patient deaf or have difficulty hearing?: No Does the patient have difficulty seeing, even when wearing glasses/contacts?: No Does the patient have difficulty concentrating, remembering, or making decisions?: No Does the patient have difficulty walking or climbing stairs?: No Does the patient have difficulty dressing or bathing?: No Does the patient have difficulty doing errands alone such as visiting a doctor's office or shopping?: No    Assessment & Plan  1. Intermittent low back pain  Stable  2. Pure hypercholesterolemia  Continue Crestor  3. Pre-diabetes  Discussed life style modification   4. OSA (obstructive sleep apnea)  He is willing to try machine, we will try to  Re-order it for him  5. Other microscopic hematuria  Keep follow up with urologist   6. Erythrocytosis  Should improve if he wears CPAP   7. Tension headache

## 2020-09-24 ENCOUNTER — Other Ambulatory Visit: Payer: Self-pay | Admitting: Family Medicine

## 2020-09-24 ENCOUNTER — Encounter: Payer: Self-pay | Admitting: Family Medicine

## 2020-09-24 ENCOUNTER — Ambulatory Visit: Payer: BC Managed Care – PPO | Admitting: Family Medicine

## 2020-09-24 ENCOUNTER — Other Ambulatory Visit: Payer: Self-pay

## 2020-09-24 ENCOUNTER — Ambulatory Visit
Admission: RE | Admit: 2020-09-24 | Discharge: 2020-09-24 | Disposition: A | Discharge status: Home / Self Care | Payer: BC Managed Care – PPO | Source: Ambulatory Visit | Attending: Urology | Admitting: Urology

## 2020-09-24 VITALS — BP 122/88 | HR 79 | Temp 98.5°F | Resp 16 | Ht 70.0 in | Wt 184.7 lb

## 2020-09-24 DIAGNOSIS — R7303 Prediabetes: Secondary | ICD-10-CM

## 2020-09-24 DIAGNOSIS — E7800 Pure hypercholesterolemia, unspecified: Secondary | ICD-10-CM | POA: Diagnosis not present

## 2020-09-24 DIAGNOSIS — G4733 Obstructive sleep apnea (adult) (pediatric): Secondary | ICD-10-CM

## 2020-09-24 DIAGNOSIS — K3189 Other diseases of stomach and duodenum: Secondary | ICD-10-CM | POA: Diagnosis not present

## 2020-09-24 DIAGNOSIS — K6389 Other specified diseases of intestine: Secondary | ICD-10-CM | POA: Diagnosis not present

## 2020-09-24 DIAGNOSIS — R109 Unspecified abdominal pain: Secondary | ICD-10-CM

## 2020-09-24 DIAGNOSIS — G44209 Tension-type headache, unspecified, not intractable: Secondary | ICD-10-CM

## 2020-09-24 DIAGNOSIS — N289 Disorder of kidney and ureter, unspecified: Secondary | ICD-10-CM | POA: Diagnosis not present

## 2020-09-24 DIAGNOSIS — M545 Low back pain, unspecified: Secondary | ICD-10-CM

## 2020-09-24 DIAGNOSIS — R3129 Other microscopic hematuria: Secondary | ICD-10-CM

## 2020-09-24 DIAGNOSIS — K760 Fatty (change of) liver, not elsewhere classified: Secondary | ICD-10-CM | POA: Diagnosis not present

## 2020-09-24 DIAGNOSIS — Z1211 Encounter for screening for malignant neoplasm of colon: Secondary | ICD-10-CM

## 2020-09-24 DIAGNOSIS — D751 Secondary polycythemia: Secondary | ICD-10-CM

## 2020-09-24 LAB — CULTURE, URINE COMPREHENSIVE

## 2020-09-27 ENCOUNTER — Ambulatory Visit: Admission: RE | Admit: 2020-09-27 | Payer: BC Managed Care – PPO | Source: Ambulatory Visit

## 2020-09-27 NOTE — Progress Notes (Signed)
09/28/2020 11:33 AM   Nicolas White 17-Jan-1976 [id number]  Referring provider: Alba Cory, MD [address] [place],  [place] [address]  Chief Complaint  Patient presents with  . Nephrolithiasis   Urological history: 1. Nephrolithiasis - CT renal stone study 2018 obstructing 4 x 5 mm stone in the right mid ureter with mild proximal hydroureteronephrosis - spontaneously passed   2. Microscopic hematuria - UA 3-10 RBC's on 09/21/2020  - urine culture negative - CT Renal stone studay 09/2020 negative for nephrolithiasis, renal masses and hydronephrosis  3. Right renal cyst - 1.3 cm on CT Renal stone study 09/2020  HPI: Nicolas White is a 45 y.o. male who presents today with micro heme and left flank pain who presents today for CT results and PSA screening.  CT renal stone study 09/2020 no stones, hydro or renal masses seen.  Bladder not distended.  Prostate unremarkable.    He continues to have the lower back pain that radiates into the left testicle.  He states it is made worse with movement.  He denied any passage of stone fragments since his last visit with [place].  Patient denies any modifying or aggravating factors.  Patient denies any gross hematuria, dysuria or suprapubic/flank pain.  Patient denies any fevers, chills, nausea or vomiting.    IPSS    Row Name 09/28/20 1000         International Prostate Symptom Score   How often have you had the sensation of not emptying your bladder? Not at All     How often have you had to urinate less than every two hours? Not at All     How often have you found you stopped and started again several times when you urinated? Not at All     How often have you found it difficult to postpone urination? Not at All     How often have you had a weak urinary stream? Not at All     How often have you had to strain to start urination? Not at All     How many times did you typically get up at night to urinate? None      Total IPSS Score 0           Quality of Life due to urinary symptoms   If you were to spend the rest of your life with your urinary condition just the way it is now how would you feel about that? Delighted            Score:  1-7 Mild 8-19 Moderate 20-35 Severe   PMH: Past Medical History:  Diagnosis Date  . Allergy   . Hyperlipidemia   . Hypertrophy, tongue, papillae   . OSA (obstructive sleep apnea) 05/27/2019    Surgical History: History reviewed. No pertinent surgical history.  Home Medications:  Allergies as of 09/28/2020   No Known Allergies     Medication List       Accurate as of September 28, 2020 11:33 AM. If you have any questions, ask your nurse or doctor.        cyclobenzaprine 5 MG tablet Commonly known as: FLEXERIL Take 1-2 tablets (5-10 mg total) by mouth at bedtime.   hydrOXYzine 10 MG tablet Commonly known as: ATARAX/VISTARIL Take 1-2 tablets (10-20 mg total) by mouth at bedtime.   ibuprofen 800 MG tablet Commonly known as: ADVIL Take 1 tablet (800 mg total) by mouth every 8 (eight) hours as  needed for moderate pain.   rosuvastatin 20 MG tablet Commonly known as: CRESTOR Take 1 tablet (20 mg total) by mouth daily.       Allergies: No Known Allergies  Family History: Family History  Problem Relation Age of Onset  . Arthritis Mother   . Hypertension Mother   . Heart attack Mother        hx of light heart attack  . Hyperlipidemia Mother   . Cancer Father        lung  . Kidney disease Brother        had a transplant (hemodialysis)  . Stroke Maternal Grandmother   . Heart disease Maternal Grandmother   . Kidney disease Maternal Grandmother   . Prostate cancer Neg Hx     Social History:  reports that he has never smoked. His smokeless tobacco use includes chew. He reports that he does not drink alcohol and does not use drugs.  ROS: Pertinent ROS in HPI  Physical Exam: BP (!) 137/91   Pulse 87   Ht 5\' 10"  (1.778 m)   Wt  184 lb (83.5 kg)   BMI 26.40 kg/m   Constitutional:  Well nourished. Alert and oriented, No acute distress. HEENT: [place] AT, mask in place.  Trachea midline Cardiovascular: No clubbing, cyanosis, or edema. Respiratory: Normal respiratory effort, no increased work of breathing. GU: No CVA tenderness.  No bladder fullness or masses.  Patient with circumcised phallus.  Urethral meatus is patent.  No penile discharge. No penile lesions or rashes. Scrotum without lesions, cysts, rashes and/or edema.  Testicles are located scrotally bilaterally. No masses are appreciated in the testicles. Left and right epididymis are normal. Rectal: Patient with  normal sphincter tone. Anus and perineum without scarring or rashes. No rectal masses are appreciated. Prostate is approximately 45 grams, no nodules are appreciated. Seminal vesicles could not be palpated  Lymph: No inguinal adenopathy. Neurologic: Grossly intact, no focal deficits, moving all 4 extremities. Psychiatric: Normal mood and affect.   Laboratory Data: No new labs since last visit   Pertinent Imaging: CLINICAL DATA:  Flank pain.  EXAM: CT ABDOMEN AND PELVIS WITHOUT CONTRAST  TECHNIQUE: Multidetector CT imaging of the abdomen and pelvis was performed following the standard protocol without IV contrast.  COMPARISON:  08/26/2016.  FINDINGS: Lower chest: Unremarkable.  Hepatobiliary: No focal abnormality in the liver on this study without intravenous contrast. Low attenuation of liver parenchyma suggests fatty deposition with areas of sparing along the gallbladder fossa. There is no evidence for gallstones, gallbladder wall thickening, or pericholecystic fluid. No intrahepatic or extrahepatic biliary dilation.  Pancreas: No focal mass lesion. No dilatation of the main duct. No intraparenchymal cyst. No peripancreatic edema.  Spleen: No splenomegaly. No focal mass lesion.  Adrenals/Urinary Tract: No adrenal nodule or mass.  13 mm water density lesion lower pole right kidney is compatible with a cyst. Left kidney unremarkable. No stones in either kidney or ureter. No secondary changes in either kidney or ureter. Bladder is decompressed.  Stomach/Bowel: Stomach is moderately distended with food. Duodenum is normally positioned as is the ligament of Treitz. No small bowel wall thickening. No small bowel dilatation. The terminal ileum is normal. The appendix is not well visualized, but there is no edema or inflammation in the region of the cecum. No gross colonic mass. No colonic wall thickening.  Vascular/Lymphatic: No abdominal aortic aneurysm. No abdominal lymphadenopathy no pelvic lymphadenopathy  Reproductive: The prostate gland and seminal vesicles are unremarkable.  Other: No  intraperitoneal free fluid.  Musculoskeletal: No worrisome lytic or sclerotic osseous abnormality.  IMPRESSION: 1. No acute findings in the abdomen or pelvis. Specifically, no findings to explain the patient's history of flank pain. 2. Hepatic steatosis.   Electronically Signed   By: Kennith Center M.D.   On: 09/25/2020 12:38 I have independently reviewed the films.  See HPI.   Assessment & Plan:       1. Nephrolithiasis - no stones seen on KUB - no stones seen on CT renal stone study   2. Left Flank Pain - likely MSK in etiology  - follow up with PCP  3. Microscopic hematuria - CT scan is negative - schedule cystoscopy  - I have explained to the patient that they will  be scheduled for a cystoscopy in our office to evaluate their bladder.  The cystoscopy consists of passing a tube with a lens up through their urethra and into their urinary bladder.   We will inject the urethra with a lidocaine gel prior to introducing the cystoscope to help with any discomfort during the procedure.   After the procedure, they might experience blood in the urine and discomfort with urination.  This will abate after the  first few voids.  I have  encouraged the patient to increase water intake  during this time.  Patient denies any allergies to lidocaine.   4. PSA screening - PSA pending  5. Right renal cyst - explained to the patient that this is a benign finding   Return for cysto for micro heme .  These notes generated with voice recognition software. I apologize for typographical errors.  Michiel Cowboy, PA-C  [facility] [address]  [address] [place], [place] [address] [phone]

## 2020-09-28 ENCOUNTER — Encounter: Payer: Self-pay | Admitting: Urology

## 2020-09-28 ENCOUNTER — Other Ambulatory Visit: Payer: Self-pay

## 2020-09-28 ENCOUNTER — Ambulatory Visit (INDEPENDENT_AMBULATORY_CARE_PROVIDER_SITE_OTHER): Payer: BC Managed Care – PPO | Admitting: Urology

## 2020-09-28 VITALS — BP 137/91 | HR 87 | Ht 70.0 in | Wt 184.0 lb

## 2020-09-28 DIAGNOSIS — R3129 Other microscopic hematuria: Secondary | ICD-10-CM

## 2020-09-28 DIAGNOSIS — Z125 Encounter for screening for malignant neoplasm of prostate: Secondary | ICD-10-CM | POA: Diagnosis not present

## 2020-09-28 DIAGNOSIS — N281 Cyst of kidney, acquired: Secondary | ICD-10-CM | POA: Diagnosis not present

## 2020-09-29 ENCOUNTER — Encounter: Payer: Self-pay | Admitting: Family Medicine

## 2020-09-29 LAB — PSA: Prostate Specific Ag, Serum: 0.8 ng/mL (ref 0.0–4.0)

## 2020-10-13 ENCOUNTER — Other Ambulatory Visit: Payer: BC Managed Care – PPO | Admitting: Urology

## 2020-10-15 ENCOUNTER — Encounter: Payer: Self-pay | Admitting: Family Medicine

## 2020-10-15 ENCOUNTER — Other Ambulatory Visit: Payer: Self-pay | Admitting: Family Medicine

## 2020-10-15 MED ORDER — OLOPATADINE HCL 0.1 % OP SOLN: 1.0000 [drp] | Freq: Two times a day (BID) | OPHTHALMIC | 2 refills | Status: DC

## 2020-10-25 ENCOUNTER — Telehealth: Payer: Self-pay | Admitting: Urology

## 2020-10-25 NOTE — Telephone Encounter (Signed)
I called patient and left a message to call back to reschedule the cysto his missed.  Nicolas White

## 2020-10-25 NOTE — Telephone Encounter (Signed)
-----   Message from Harle Battiest, PA-C sent at 10/24/2020 10:09 PM EDT ----- Regarding: Cancelled Cysto This gentleman cancelled his cystoscopy with Dr. Lonna Cobb.  The notes in the chart stated he will call back and reschedule, but I was not notified that he had cancelled.  The cysto was for micro heme and we need to make sure he follows up to his cysto.  I would hate to miss a bladder cancer.

## 2021-01-27 ENCOUNTER — Telehealth (INDEPENDENT_AMBULATORY_CARE_PROVIDER_SITE_OTHER): Payer: BC Managed Care – PPO | Admitting: Unknown Physician Specialty

## 2021-01-27 ENCOUNTER — Telehealth: Payer: Self-pay

## 2021-01-27 ENCOUNTER — Encounter: Payer: Self-pay | Admitting: Unknown Physician Specialty

## 2021-01-27 ENCOUNTER — Other Ambulatory Visit: Payer: Self-pay

## 2021-01-27 DIAGNOSIS — L439 Lichen planus, unspecified: Secondary | ICD-10-CM

## 2021-01-27 DIAGNOSIS — J069 Acute upper respiratory infection, unspecified: Secondary | ICD-10-CM | POA: Diagnosis not present

## 2021-01-27 MED ORDER — CLOTRIMAZOLE-BETAMETHASONE 1-0.05 % EX CREA: 1.0000 "application " | TOPICAL_CREAM | Freq: Every day | CUTANEOUS | 0 refills | Status: DC

## 2021-01-27 MED ORDER — BENZONATATE 200 MG PO CAPS: 200.0000 mg | ORAL_CAPSULE | Freq: Two times a day (BID) | ORAL | 0 refills | Status: DC | PRN

## 2021-01-27 NOTE — Telephone Encounter (Signed)
Copied from CRM [id number]. Topic: General - Other >> Jan 27, 2021  8:45 AM Tamela Oddi wrote: Reason for CRM: Patient would like the nurse to call regarding his congestion and some small bumps that he has on his arms.  There was not an appt. Available until August.  Patient would like to know if some meds could be called in for him.  Please advise.

## 2021-01-27 NOTE — Progress Notes (Signed)
There were no vitals taken for this visit.   Subjective:    Patient ID: Nicolas White, male    DOB: 1975-08-13, 45 y.o.   MRN: [id number]  HPI: Nicolas White is a 45 y.o. male  Chief Complaint  Patient presents with   URI    Hoarse, congested, cough, for 5 days   Rash    Bumps on arms    Due to the catastrophic nature of the COVID-19 pandemic, this visit was completed via audio and visual contact via [facility] due to the restrictions of the COVID-19 pandemic. All issues as above were discussed and addressed. Physical exam was done as above through visual confirmation on [facility] If it was felt that the patient should be evaluated in the office, they were directed there. The patient verbally consented to this visit."} Location of the patient: work Location of the provider: home Those involved with this call:  Provider: Gabriel Cirri, DNP Time spent on call: 15 minutes with 10 minute chart review I verified patient identity using two factors (patient name and date of birth). Patient consents verbally to being seen via telemedicine visit today.    URI  This is a new problem. Episode onset: symptoms for 5 days. The problem has been gradually improving. Associated symptoms include chest pain, congestion, coughing, a rash and a sore throat. Pertinent negatives include no headaches, sinus pain, vomiting or wheezing. Treatments tried: robitussin.  Rash This is a new problem. Episode onset: 2 weeks. The problem has been gradually worsening since onset. Rash characteristics: gets worse in the sun. Associated symptoms include congestion, coughing and a sore throat. Pertinent negatives include no vomiting. Past treatments include nothing.   Relevant past medical, surgical, family and social history reviewed and updated as indicated. Interim medical history since our last visit reviewed. Allergies and medications reviewed and updated.  Review of Systems  HENT:  Positive for congestion  and sore throat. Negative for sinus pain.   Respiratory:  Positive for cough. Negative for wheezing.   Cardiovascular:  Positive for chest pain.  Gastrointestinal:  Negative for vomiting.  Skin:  Positive for rash.  Neurological:  Negative for headaches.   Per HPI unless specifically indicated above     Objective:    There were no vitals taken for this visit.  Wt Readings from Last 3 Encounters:  09/28/20 184 lb (83.5 kg)  09/24/20 184 lb 11.2 oz (83.8 kg)  09/21/20 184 lb 14.4 oz (83.9 kg)    Physical Exam Constitutional:      General: He is not in acute distress.    Appearance: Normal appearance. He is well-developed.  HENT:     Head: Normocephalic and atraumatic.  Eyes:     General: Lids are normal. No scleral icterus.       Right eye: No discharge.        Left eye: No discharge.     Conjunctiva/sclera: Conjunctivae normal.  Pulmonary:     Effort: Pulmonary effort is normal.  Abdominal:     Palpations: There is no hepatomegaly or splenomegaly.  Musculoskeletal:        General: Normal range of motion.  Skin:    Coloration: Skin is not pale.     Findings: No rash.     Comments: Papular rash left arm in a patch  Neurological:     Mental Status: He is alert and oriented to person, place, and time.  Psychiatric:        Behavior: Behavior  normal.        Thought Content: Thought content normal.        Judgment: Judgment normal.    Results for orders placed or performed in visit on 09/28/20  PSA  Result Value Ref Range   Prostate Specific Ag, Serum 0.8 0.0 - 4.0 ng/mL      Assessment & Plan:   Problem List Items Addressed This Visit   None Visit Diagnoses     Viral upper respiratory tract infection    -  Primary   Recommended to leave work and get a Covid test before returning.  Supportive care.  Tessalon for cough   Relevant Medications   clotrimazole-betamethasone (LOTRISONE) cream   Lichen planus       Lotrisone cream BID to affected area.  Sun avoidance         Follow up plan: Return if symptoms worsen or fail to improve.

## 2021-01-27 NOTE — Telephone Encounter (Signed)
Spoke with patient and gave virtual appt

## 2021-02-03 ENCOUNTER — Encounter: Payer: Self-pay | Admitting: Unknown Physician Specialty

## 2021-02-03 ENCOUNTER — Telehealth: Payer: Self-pay

## 2021-02-03 NOTE — Telephone Encounter (Signed)
Please advise you saw him on 6/23

## 2021-02-03 NOTE — Telephone Encounter (Signed)
Copied from CRM [id number]. Topic: General - Other >> Feb 03, 2021  3:34 PM Pawlus, Maxine Glenn A wrote: Reason for CRM: Pt stated he still has a cough he can not get rid of, pt said it is the worst at night, no open appts until 7/14 and pt wanted to know what he could take before then or if anything could be sent in.

## 2021-02-08 NOTE — Telephone Encounter (Signed)
Left detailed vm °

## 2021-02-10 NOTE — Telephone Encounter (Signed)
Left another voice mail

## 2021-02-23 ENCOUNTER — Encounter: Payer: Self-pay | Admitting: Family Medicine

## 2021-02-23 ENCOUNTER — Other Ambulatory Visit: Payer: Self-pay

## 2021-02-23 ENCOUNTER — Ambulatory Visit (INDEPENDENT_AMBULATORY_CARE_PROVIDER_SITE_OTHER): Payer: BC Managed Care – PPO | Admitting: Family Medicine

## 2021-02-23 DIAGNOSIS — B029 Zoster without complications: Secondary | ICD-10-CM

## 2021-02-23 MED ORDER — VALACYCLOVIR HCL 1 G PO TABS: 1000.0000 mg | ORAL_TABLET | Freq: Three times a day (TID) | ORAL | 0 refills | Status: AC

## 2021-02-23 NOTE — Progress Notes (Signed)
    SUBJECTIVE:   CHIEF COMPLAINT / HPI:   RASH - itches and burns Duration:   3 days   Location: R side of face and head Itching: yes Burning: yes Redness: yes Oozing: no Scaling: no Blisters: no Painful: no Fevers: no Change in detergents/soaps/personal care products: no Recent illness: no Recent travel:no History of same: no Context: stable Alleviating factors: none Treatments attempted:hydrocortisone cream Shortness of breath: no  Throat/tongue swelling: no Myalgias/arthralgias: no   OBJECTIVE:   BP 128/76   Pulse 81   Temp 98.2 F (36.8 C) (Oral)   Resp 18   Ht 5\' 10"  (1.778 m)   Wt 182 lb 11.2 oz (82.9 kg)   SpO2 95%   BMI 26.21 kg/m   Gen: well appearing, in NAD Skin: erythematous blistering rash to R side of scalp and face in V2 dermatome with localized swelling. No purulence, skin breakdown noted.  ASSESSMENT/PLAN:   Shingles Exam and symptoms consistent with shingles. Will provide vacyclovir. F/u if no better after treatment.     , DO

## 2021-02-23 NOTE — Patient Instructions (Addendum)
It was great to see you!  Our plans for today:  - Take the antiviral medication for 7 days. Let us know if you don't tolerate this.  - Let us know if your rash worsens, spreads to your eye, or if it's not better after treatment.  - You can continue to use the steroid cream. - Make an appointment soon to follow up with Urology for the blood in your urine.  Take care and seek immediate care sooner if you develop any concerns.   Dr. Linwood Dibbles  Shingles  Shingles, which is also known as herpes zoster, is an infection that causes apainful skin rash and fluid-filled blisters. It is caused by a virus. Shingles only develops in people who: Have had chickenpox. Have been vaccinated against chickenpox. Shingles is rare in this group. What are the causes? Shingles is caused by varicella-zoster virus. This is the same virus that causes chickenpox. After a person is exposed to the virus, it stays in the body in an inactive (dormant) state. Shingles develops if the virus is reactivated. This can happen many years after the first (initial) exposure to the virus. It is not known what causes this virus to bereactivated. What increases the risk? People who have had chickenpox or received the chickenpox vaccine are at risk for shingles. Shingles infection is more common in people who: Are older than 46 years of age. Have a weakened disease-fighting system (immune system), such as people with: HIV (human immunodeficiency virus). AIDS (acquired immunodeficiency syndrome). Cancer. Are taking medicines that weaken the immune system, such as organ transplant medicines. Are experiencing a lot of stress. What are the signs or symptoms? Early symptoms of this condition include itching, tingling, and pain in an areaon your skin. Pain may be described as burning, stabbing, or throbbing. A few days or weeks after early symptoms start, a painful red rash appears. The rash is usually on one side of the body and has a  band-like or belt-like pattern. The rash eventually turns into fluid-filled blisters that break open,change into scabs, and dry up in about 2-3 weeks. At any time during the infection, you may also develop: A fever. Chills. A headache. Nausea. How is this diagnosed? This condition is diagnosed with a skin exam. Skin or fluid samples (a culture) may be taken from the blisters before a diagnosis is made. How is this treated? The rash may last for several weeks. There is not a specific cure for this condition. Your health care provider may prescribe medicines to help you manage pain, recover more quickly, and avoid long-term problems. Medicines may include: Antiviral medicines. Anti-inflammatory medicines. Pain medicines. Anti-itching medicines (antihistamines). If the area involved is on your face, you may be referred to a specialist, such as an eye doctor (ophthalmologist) or an ear, nose, and throat (ENT) doctor (otorhinolaryngologist) to help you avoid eye problems, chronic pain, or disability. Follow these instructions at home: Medicines Take over-the-counter and prescription medicines only as told by your health care provider. Apply an anti-itch cream or numbing cream to the affected area as told by your health care provider. Relieving itching and discomfort  Apply cold, wet cloths (cold compresses) to the area of the rash or blisters as told by your health care provider. Cool baths can be soothing. Try adding baking soda or dry oatmeal to the water to reduce itching. Do not bathe in hot water. Use calamine lotion as recommended by your health care provider. This is an over-the-counter lotion that helps to  relieve itchiness.  Blister and rash care Keep your rash covered with a loose bandage (dressing). Wear loose-fitting clothing to help ease the pain of material rubbing against the rash. Wash your hands with soap and water for at least 20 seconds before and after you change your  dressing. If soap and water are not available, use hand sanitizer. Change your dressing as told by your health care provider. Keep your rash and blisters clean by washing the area with mild soap and cool water as told by your health care provider. Check your rash every day for signs of infection. Check for: More redness, swelling, or pain. Fluid or blood. Warmth. Pus or a bad smell. Do not scratch your rash or pick at your blisters. To help avoid scratching: Keep your fingernails clean and cut short. Wear gloves or mittens while you sleep, if scratching is a problem. General instructions Rest as told by your health care provider. Wash your hands often with soap and water for at least 20 seconds. If soap and water are not available, use hand sanitizer. Doing this lowers your chance of getting a bacterial skin infection. Before your blisters change into scabs, your shingles infection can cause chickenpox in people who have never had it or have never been vaccinated against it. To prevent this from happening, avoid contact with other people, especially: Babies. Pregnant women. Children who have eczema. Older people who have transplants. People who have chronic illnesses, such as cancer or AIDS. Keep all follow-up visits. This is important. How is this prevented? Getting vaccinated is the best way to prevent shingles and protect against shingles complications. If you have not been vaccinated, talk with your healthcare provider about getting the vaccine. Where to find more information Centers for Disease Control and Prevention: [employer].br Contact a health care provider if: Your pain is not relieved with prescribed medicines. Your pain does not get better after the rash heals. You have any of these signs of infection: More redness, swelling, or pain around the rash. Fluid or blood coming from the rash. Warmth coming from your rash. Pus or a bad smell coming from the rash. A fever. Get  help right away if: The rash is on your face or nose. You have facial pain, pain around your eye area, or loss of feeling on one side of your face. You have difficulty seeing. You have ear pain or have ringing in your ear. You have a loss of taste. Your condition gets worse. Summary Shingles, also known as herpes zoster, is an infection that causes a painful skin rash and fluid-filled blisters. This condition is diagnosed with a skin exam. Skin or fluid samples (a culture) may be taken from the blisters. Keep your rash covered with a loose bandage (dressing). Wear loose-fitting clothing to help ease the pain of material rubbing against the rash. Before your blisters change into scabs, your shingles infection can cause chickenpox in people who have never had it or have never been vaccinated against it. This information is not intended to replace advice given to you by your health care provider. Make sure you discuss any questions you have with your healthcare provider. Document Revised: 07/19/2020 Document Reviewed: 07/19/2020 Elsevier Patient Education  2022 [employer].

## 2021-02-23 NOTE — Assessment & Plan Note (Signed)
Exam and symptoms consistent with shingles. Will provide vacyclovir. F/u if no better after treatment.

## 2021-03-03 ENCOUNTER — Encounter: Payer: Self-pay | Admitting: Family Medicine

## 2021-03-23 NOTE — Progress Notes (Signed)
Name: Nicolas White   MRN: [id number]    DOB: 1975-09-03   Date:03/25/2021       Progress Note  Subjective  Chief Complaint  Follow Up  HPI  Anger problems: doing much better, sleeping better now, still has hydroxyzine at home but has not taken it a while.    Headaches: he states when it happens is now on top of his head, pressure like , around his head, mild , usually when upset or tired. He states not recent episodes    Intermittent low back pain: he still has medications at home  He states has intermittent stiffness,, usually worse at the end of the day when sitting down. He states it can radiate to abdomen or left groin, also feels in the mornings but improves with activity. No recent problems, not taking any medication lately    Hyperlipidemia: taking crestor, denies  myalgia, continue medications LDL was at goal for him at 92   History of kidney stone: last CT was negative, under the care of Urologist, denies hematuria or flank pain   OSA: also has increase HCT/Hbg, discussed using CPAP machine, he never went to go get it, explained it will help with his HCT. Discussed increase risk of heart attacks and strokes   Pre-diabetes: denies polyphagia, polydipsia or polyuria, last A1C 6 % , unchanged   Patient Active Problem List   Diagnosis Date Noted   Shingles 02/23/2021   OSA (obstructive sleep apnea) 05/27/2019   Erythrocytosis 12/18/2017   Hyperlipidemia 08/27/2015   Displacement of cervical intervertebral disc without myelopathy 08/26/2015   Calculus of kidney 08/26/2015   Cervical radiculitis 08/26/2015   Hypertrophy of tongue papillae 08/26/2015   Allergic rhinitis, seasonal 10/22/2008    No past surgical history on file.  Family History  Problem Relation Age of Onset   Arthritis Mother    Hypertension Mother    Heart attack Mother        hx of light heart attack   Hyperlipidemia Mother    Cancer Father        lung   Kidney disease Brother        had a  transplant (hemodialysis)   Stroke Maternal Grandmother    Heart disease Maternal Grandmother    Kidney disease Maternal Grandmother    Prostate cancer Neg Hx     Social History   Tobacco Use   Smoking status: Never   Smokeless tobacco: Current    Types: Chew   Tobacco comments:    patient has chewed tobacco for 25 years  Substance Use Topics   Alcohol use: No    Alcohol/week: 0.0 standard drinks     Current Outpatient Medications:    ibuprofen (ADVIL) 800 MG tablet, Take 1 tablet (800 mg total) by mouth every 8 (eight) hours as needed for moderate pain., Disp: 90 tablet, Rfl: 0   rosuvastatin (CRESTOR) 20 MG tablet, Take 1 tablet (20 mg total) by mouth daily., Disp: 90 tablet, Rfl: 1   benzonatate (TESSALON) 200 MG capsule, Take 1 capsule (200 mg total) by mouth 2 (two) times daily as needed for cough. (Patient not taking: No sig reported), Disp: 20 capsule, Rfl: 0   clotrimazole-betamethasone (LOTRISONE) cream, Apply 1 application topically daily. (Patient not taking: Reported on 03/25/2021), Disp: 30 g, Rfl: 0   cyclobenzaprine (FLEXERIL) 5 MG tablet, Take 1-2 tablets (5-10 mg total) by mouth at bedtime. (Patient not taking: No sig reported), Disp: 90 tablet, Rfl: 0   hydrOXYzine (  ATARAX/VISTARIL) 10 MG tablet, Take 1-2 tablets (10-20 mg total) by mouth at bedtime. (Patient not taking: No sig reported), Disp: 30 tablet, Rfl: 0   olopatadine (PATANOL) 0.1 % ophthalmic solution, Place 1 drop into both eyes 2 (two) times daily. (Patient not taking: No sig reported), Disp: 5 mL, Rfl: 2  No Known Allergies  I personally reviewed active problem list, medication list, allergies, family history, social history, health maintenance with the patient/caregiver today.   ROS  Constitutional: Negative for fever or weight change.  Respiratory: Negative for cough and shortness of breath.   Cardiovascular: Negative for chest pain or palpitations.  Gastrointestinal: Negative for abdominal  pain, no bowel changes.  Musculoskeletal: Negative for gait problem or joint swelling.  Skin: Negative for rash.  Neurological: Negative for dizziness or headache.  No other specific complaints in a complete review of systems (except as listed in HPI above).    Objective  Vitals:   03/25/21 1323  BP: 110/70  Pulse: 89  Resp: 18  Temp: 97.9 F (36.6 C)  TempSrc: Oral  SpO2: 96%  Weight: 180 lb (81.6 kg)  Height: 5\' 10"  (1.778 m)    Body mass index is 25.83 kg/m.  Physical Exam  Constitutional: Patient appears well-developed and well-nourished.  No distress.  HEENT: head atraumatic, normocephalic, pupils equal and reactive to light, neck supple Cardiovascular: Normal rate, regular rhythm and normal heart sounds.  No murmur heard. No BLE edema. Pulmonary/Chest: Effort normal and breath sounds normal. No respiratory distress. Abdominal: Soft.  There is no tenderness. Psychiatric: Patient has a normal mood and affect. behavior is normal. Judgment and thought content normal.   PHQ2/9: Depression screen [facility] 2/9 03/25/2021 02/23/2021 01/27/2021 09/24/2020 06/24/2020  Decreased Interest 0 0 0 0 0  Down, Depressed, Hopeless 0 0 0 0 0  PHQ - 2 Score 0 0 0 0 0  Altered sleeping - - - - 0  Tired, decreased energy - - - - 1  Change in appetite - - - - 0  Feeling bad or failure about yourself  - - - - 0  Trouble concentrating - - - - 0  Moving slowly or fidgety/restless - - - - 0  Suicidal thoughts - - - - 0  PHQ-9 Score - - - - 1  Difficult doing work/chores - - - - Not difficult at all  Some recent data might be hidden    phq 9 is negative   Fall Risk: Fall Risk  03/25/2021 02/23/2021 01/27/2021 09/24/2020 06/24/2020  Falls in the past year? 0 0 0 0 0  Number falls in past yr: 0 0 0 0 0  Injury with Fall? 0 0 0 0 0  Follow up Falls evaluation completed Falls evaluation completed Falls evaluation completed - -    Functional Status Survey: Is the patient deaf or have difficulty  hearing?: No Does the patient have difficulty seeing, even when wearing glasses/contacts?: No Does the patient have difficulty concentrating, remembering, or making decisions?: No Does the patient have difficulty walking or climbing stairs?: No Does the patient have difficulty dressing or bathing?: No Does the patient have difficulty doing errands alone such as visiting a doctor's office or shopping?: No    Assessment & Plan  1. Intermittent low back pain  Doing well at this time  2. Pure hypercholesterolemia  - rosuvastatin (CRESTOR) 20 MG tablet; Take 1 tablet (20 mg total) by mouth daily.  Dispense: 90 tablet; Refill: 1  3. Pre-diabetes  Reviewed  diabetic diet   4. OSA (obstructive sleep apnea)  We will send order again   5. Tension headache

## 2021-03-25 ENCOUNTER — Encounter: Payer: Self-pay | Admitting: Family Medicine

## 2021-03-25 ENCOUNTER — Other Ambulatory Visit: Payer: Self-pay

## 2021-03-25 ENCOUNTER — Ambulatory Visit: Payer: BC Managed Care – PPO | Admitting: Family Medicine

## 2021-03-25 ENCOUNTER — Telehealth: Payer: Self-pay | Admitting: Internal Medicine

## 2021-03-25 VITALS — BP 110/70 | HR 89 | Temp 97.9°F | Resp 18 | Ht 70.0 in | Wt 180.0 lb

## 2021-03-25 DIAGNOSIS — R7303 Prediabetes: Secondary | ICD-10-CM | POA: Diagnosis not present

## 2021-03-25 DIAGNOSIS — G44209 Tension-type headache, unspecified, not intractable: Secondary | ICD-10-CM

## 2021-03-25 DIAGNOSIS — M545 Low back pain, unspecified: Secondary | ICD-10-CM | POA: Diagnosis not present

## 2021-03-25 DIAGNOSIS — E7800 Pure hypercholesterolemia, unspecified: Secondary | ICD-10-CM

## 2021-03-25 DIAGNOSIS — G4733 Obstructive sleep apnea (adult) (pediatric): Secondary | ICD-10-CM | POA: Diagnosis not present

## 2021-03-25 MED ORDER — ROSUVASTATIN CALCIUM 20 MG PO TABS: 20.0000 mg | ORAL_TABLET | Freq: Every day | ORAL | 1 refills | Status: DC

## 2021-03-25 NOTE — Telephone Encounter (Signed)
LMTCB Sally E Ottinger ° °

## 2021-03-25 NOTE — Telephone Encounter (Signed)
Copied from the CPAP referral order  Note   Per [facility], pt was a no show for his original setup appointment. Pt has been contacted several times to R/S and no response from patient. Received 10/01/2018. Rhonda J Cobb

## 2021-03-28 ENCOUNTER — Telehealth: Payer: Self-pay | Admitting: Family Medicine

## 2021-03-28 NOTE — Telephone Encounter (Signed)
Spoke to Ruby with [facility] and relayed below message.  Olegario Messier stated that she would forward this message to Dr. Corliss Marcus to patient and recommend that he contact [facility] to reschedule cpap setup. Patient voiced his understanding and had no further questions.  Nothing further needed.

## 2021-03-28 NOTE — Telephone Encounter (Signed)
Spoke to [employer], They will reach out to patient to schedule Cpap

## 2021-03-28 NOTE — Telephone Encounter (Signed)
Margie with Pulmonary states someone called her concerning pt's CPAP.  Note    Per [facility], pt was a no show for his original setup appointment. Pt has been contacted several times to R/S and no response from patient. Received 10/01/2018. Rhonda J Cobb

## 2021-04-28 NOTE — Telephone Encounter (Signed)
Lm for patient.  

## 2021-04-29 NOTE — Telephone Encounter (Signed)
Lm x2 for patient.  Will close encounter per office protocol.   

## 2021-04-29 NOTE — Telephone Encounter (Signed)
Spoke to patient, who is requesting [facility] contact number. He would like to be setup on cpap.  Contact number has been provided.  Nothing further needed.

## 2021-07-04 NOTE — Progress Notes (Signed)
Name: Nicolas White   MRN: [id number]    DOB: Jul 09, 1976   Date:07/05/2021       Progress Note  Subjective  Chief Complaint  Annual Exam  HPI   Colon cancer screening : Discussed Cologuard and Colonoscopy options. Patient stated he would rather use Cologuard first. Denies blood in bowel movements, pain or weight loss.   Prostate cancer screening: PSA was 0.8 on 09/28/20. Patient denies difficulty urinating, reduced stream, incomplete voiding. Denies blood in urine.   Advanced Care planning: Discussed MOST forms and need for advanced care plan to be in place. Provided information for patient to review prior to filling out directives.     IPSS Questionnaire (AUA-7): Over the past month.   1)  How often have you had a sensation of not emptying your bladder completely after you finish urinating?  0 - Not at all  2)  How often have you had to urinate again less than two hours after you finished urinating? 0 - Not at all  3)  How often have you found you stopped and started again several times when you urinated?  0 - Not at all  4) How difficult have you found it to postpone urination?  0 - Not at all  5) How often have you had a weak urinary stream?  0 - Not at all  6) How often have you had to push or strain to begin urination?  0 - Not at all  7) How many times did you most typically get up to urinate from the time you went to bed until the time you got up in the morning?  0 - None  Total score:  0-7 mildly symptomatic   8-19 moderately symptomatic   20-35 severely symptomatic     Diet: Patient reports he is eating well. He was amenable to discussion of balanced diet and eating increased vegetables, whole grains, lean protein.  Exercise: Patient states he is not exercising.   Depression: phq 9 is negative Depression screen [facility] 2/9 07/05/2021 03/25/2021 02/23/2021 01/27/2021 09/24/2020  Decreased Interest 0 0 0 0 0  Down, Depressed, Hopeless 0 0 0 0 0  PHQ - 2 Score 0 0 0 0 0   Altered sleeping 0 - - - -  Tired, decreased energy 1 - - - -  Change in appetite 0 - - - -  Feeling bad or failure about yourself  0 - - - -  Trouble concentrating 0 - - - -  Moving slowly or fidgety/restless 0 - - - -  Suicidal thoughts 0 - - - -  PHQ-9 Score 1 - - - -  Difficult doing work/chores - - - - -  Some recent data might be hidden    Hypertension:  BP Readings from Last 3 Encounters:  07/05/21 130/84  03/25/21 110/70  02/23/21 128/76    Obesity: Wt Readings from Last 3 Encounters:  07/05/21 187 lb (84.8 kg)  03/25/21 180 lb (81.6 kg)  02/23/21 182 lb 11.2 oz (82.9 kg)   BMI Readings from Last 3 Encounters:  07/05/21 26.83 kg/m  03/25/21 25.83 kg/m  02/23/21 26.21 kg/m     Lipids:  Lab Results  Component Value Date   CHOL 153 06/24/2020   CHOL 129 05/26/2019   CHOL 138 06/18/2018   Lab Results  Component Value Date   HDL 41 06/24/2020   HDL 46 05/26/2019   HDL 34 (L) 06/18/2018   Lab Results  Component Value Date  LDLCALC 92 06/24/2020   LDLCALC 70 05/26/2019   LDLCALC 74 06/18/2018   Lab Results  Component Value Date   TRIG 103 06/24/2020   TRIG 51 05/26/2019   TRIG 201 (H) 06/18/2018   Lab Results  Component Value Date   CHOLHDL 3.7 06/24/2020   CHOLHDL 2.8 05/26/2019   CHOLHDL 4.1 06/18/2018   No results found for: LDLDIRECT Glucose:  Glucose, Bld  Date Value Ref Range Status  06/24/2020 86 65 - 99 mg/dL Final    Comment:    .            Fasting reference interval .   05/26/2019 82 65 - 99 mg/dL Final    Comment:    .            Fasting reference interval .   06/18/2018 96 65 - 139 mg/dL Final    Comment:    .        Non-fasting reference interval .     [place] Office Visit from 03/25/2021 in [facility]  AUDIT-C Score 0       Married STD testing and prevention (HIV/chl/gon/syphilis): N/A Hep C: 06/24/20  Skin cancer: Discussed monitoring for atypical lesions, ABCs of melanoma,  and appropriate sunscreen application.  Colorectal cancer: Initiating screening cologuard Prostate cancer: 0.8    Lung cancer: Low Dose CT Chest recommended if Age 9-80 years, 30 pack-year currently smoking OR have quit w/in 15years. Patient does not qualify.   AAA:  The USPSTF recommends one-time screening with ultrasonography in men ages 29 to 68 years who have ever smoked ECG:  05/26/19  Vaccines:  HPV: up to at age 77 , ask insurance if age between 61-45  Shingrix: 78-64 yo and ask insurance if covered when patient above 91 yo Pneumonia:  educated and discussed with patient. Flu:  educated and discussed with patient.  Advanced Care Planning: A voluntary discussion about advance care planning including the explanation and discussion of advance directives.  Discussed health care proxy and Living will, and the patient was able to identify a health care proxy as wife.  Patient does not have a living will at present time. If patient does have living will, I have requested they bring this to the clinic to be scanned in to their chart.  Patient Active Problem List   Diagnosis Date Noted   Shingles 02/23/2021   OSA (obstructive sleep apnea) 05/27/2019   Erythrocytosis 12/18/2017   Hyperlipidemia 08/27/2015   Displacement of cervical intervertebral disc without myelopathy 08/26/2015   Calculus of kidney 08/26/2015   Cervical radiculitis 08/26/2015   Hypertrophy of tongue papillae 08/26/2015   Allergic rhinitis, seasonal 10/22/2008    No past surgical history on file.  Family History  Problem Relation Age of Onset   Arthritis Mother    Hypertension Mother    Heart attack Mother        hx of light heart attack   Hyperlipidemia Mother    Cancer Father        lung   Kidney disease Brother        had a transplant (hemodialysis)   Stroke Maternal Grandmother    Heart disease Maternal Grandmother    Kidney disease Maternal Grandmother    Prostate cancer Neg Hx     Social  History   Socioeconomic History   Marital status: Married    Spouse name: Verdis Frederickson    Number of children: 3   Years of education: Not on  file   Highest education level: Some college, no degree  Occupational History   Occupation: Financial controller   Tobacco Use   Smoking status: Never   Smokeless tobacco: Current    Types: Chew   Tobacco comments:    patient has chewed tobacco for 25 years  Vaping Use   Vaping Use: Never used  Substance and Sexual Activity   Alcohol use: No    Alcohol/week: 0.0 standard drinks   Drug use: No   Sexual activity: Yes    Partners: Female  Other Topics Concern   Not on file  Social History Narrative   Not on file   Social Determinants of Health   Financial Resource Strain: Not on file  Food Insecurity: Not on file  Transportation Needs: Not on file  Physical Activity: Not on file  Stress: Not on file  Social Connections: Not on file  Intimate Partner Violence: Not on file     Current Outpatient Medications:    cyclobenzaprine (FLEXERIL) 5 MG tablet, Take 1-2 tablets (5-10 mg total) by mouth at bedtime., Disp: 90 tablet, Rfl: 0   hydrOXYzine (ATARAX/VISTARIL) 10 MG tablet, Take 1-2 tablets (10-20 mg total) by mouth at bedtime., Disp: 30 tablet, Rfl: 0   ibuprofen (ADVIL) 800 MG tablet, Take 1 tablet (800 mg total) by mouth every 8 (eight) hours as needed for moderate pain., Disp: 90 tablet, Rfl: 0   rosuvastatin (CRESTOR) 20 MG tablet, Take 1 tablet (20 mg total) by mouth daily., Disp: 90 tablet, Rfl: 1  No Known Allergies   Review of Systems  Constitutional:  Positive for malaise/fatigue. Negative for diaphoresis and weight loss.  Respiratory:  Negative for shortness of breath and wheezing.   Cardiovascular:  Negative for chest pain and palpitations.  Gastrointestinal:  Negative for constipation, diarrhea, melena, nausea and vomiting.  Genitourinary:  Negative for dysuria and hematuria.  Neurological:  Negative for dizziness, tingling,  weakness and headaches.  Psychiatric/Behavioral:  Negative for depression. The patient is not nervous/anxious and does not have insomnia.      Objective  Vitals:   07/05/21 1446  BP: 130/84  Pulse: 88  Resp: 16  Temp: 98 F (36.7 C)  SpO2: 98%  Weight: 187 lb (84.8 kg)  Height: 5\' 10"  (1.778 m)    Body mass index is 26.83 kg/m.  Physical Exam  Constitutional: Patient appears well-developed and well-nourished. No distress.  HENT: Head: Normocephalic and atraumatic. Ears: B TMs ok, no erythema or effusion; Nose: Not done  Mouth/Throat: not done  Eyes: Conjunctivae and EOM are normal. Pupils are equal, round, and reactive to light. No scleral icterus.  Neck: Normal range of motion. Neck supple. No JVD present. No thyromegaly present.  Cardiovascular: Normal rate, regular rhythm and normal heart sounds.  No murmur heard. No BLE edema. Pulmonary/Chest: Effort normal and breath sounds normal. No respiratory distress. Abdominal: Soft. Bowel sounds are normal, no distension. There is no tenderness. no masses MALE GENITALIA: Not done  RECTAL: not done  Musculoskeletal: Normal range of motion, no joint effusions. No gross deformities Neurological: he is alert and oriented to person, place, and time. No cranial nerve deficit. Coordination, balance, strength, speech and gait are normal.  Skin: Skin is warm and dry. No rash noted. No erythema.  Psychiatric: Patient has a normal mood and affect. behavior is normal. Judgment and thought content normal.    Fall Risk: Fall Risk  07/05/2021 03/25/2021 02/23/2021 01/27/2021 09/24/2020  Falls in the past year? 0 0 0  0 0  Number falls in past yr: 0 0 0 0 0  Injury with Fall? 0 0 0 0 0  Risk for fall due to : No Fall Risks - - - -  Follow up Falls prevention discussed Falls evaluation completed Falls evaluation completed Falls evaluation completed -      Functional Status Survey: Is the patient deaf or have difficulty hearing?: No Does the  patient have difficulty seeing, even when wearing glasses/contacts?: No Does the patient have difficulty concentrating, remembering, or making decisions?: No Does the patient have difficulty walking or climbing stairs?: No Does the patient have difficulty dressing or bathing?: No Does the patient have difficulty doing errands alone such as visiting a doctor's office or shopping?: No    Assessment & Plan  1. Well adult exam  - COMPLETE METABOLIC PANEL WITH GFR - CBC w/Diff/Platelet - Lipid Profile - HgB A1c  2. Need for immunization against influenza  - Flu Vaccine QUAD 6+ mos PF IM (Fluarix Quad PF)  3. Screening for colon cancer  - Cologuard    -Prostate cancer screening and PSA options (with potential risks and benefits of testing vs not testing) were discussed along with recent recs/guidelines. -USPSTF grade A and B recommendations reviewed with patient; age-appropriate recommendations, preventive care, screening tests, etc discussed and encouraged; healthy living encouraged; see AVS for patient education given to patient -Discussed importance of 150 minutes of physical activity weekly, eat two servings of fish weekly, eat one serving of tree nuts ( cashews, pistachios, pecans, almonds.Marland Kitchen) every other day, eat 6 servings of fruit/vegetables daily and drink plenty of water and avoid sweet beverages.

## 2021-07-05 ENCOUNTER — Ambulatory Visit (INDEPENDENT_AMBULATORY_CARE_PROVIDER_SITE_OTHER): Payer: BC Managed Care – PPO | Admitting: Family Medicine

## 2021-07-05 ENCOUNTER — Encounter: Payer: Self-pay | Admitting: Family Medicine

## 2021-07-05 ENCOUNTER — Other Ambulatory Visit: Payer: Self-pay

## 2021-07-05 VITALS — BP 130/84 | HR 88 | Temp 98.0°F | Resp 16 | Ht 70.0 in | Wt 187.0 lb

## 2021-07-05 DIAGNOSIS — Z23 Encounter for immunization: Secondary | ICD-10-CM

## 2021-07-05 DIAGNOSIS — Z1211 Encounter for screening for malignant neoplasm of colon: Secondary | ICD-10-CM

## 2021-07-05 DIAGNOSIS — Z0000 Encounter for general adult medical examination without abnormal findings: Secondary | ICD-10-CM | POA: Diagnosis not present

## 2021-07-05 NOTE — Patient Instructions (Signed)
Check on insurance coverage for Cologuard  Continue current medications  Follow up in 6 months for routine appointment.

## 2021-07-06 LAB — COMPLETE METABOLIC PANEL WITH GFR
AG Ratio: 1.6 (calc) (ref 1.0–2.5)
ALT: 37 U/L (ref 9–46)
AST: 26 U/L (ref 10–40)
Albumin: 4.9 g/dL (ref 3.6–5.1)
Alkaline phosphatase (APISO): 91 U/L (ref 36–130)
BUN: 12 mg/dL (ref 7–25)
CO2: 30 mmol/L (ref 20–32)
Calcium: 10 mg/dL (ref 8.6–10.3)
Chloride: 100 mmol/L (ref 98–110)
Creat: 0.96 mg/dL (ref 0.60–1.29)
Globulin: 3.1 g/dL (calc) (ref 1.9–3.7)
Glucose, Bld: 80 mg/dL (ref 65–99)
Potassium: 3.8 mmol/L (ref 3.5–5.3)
Sodium: 139 mmol/L (ref 135–146)
Total Bilirubin: 0.6 mg/dL (ref 0.2–1.2)
Total Protein: 8 g/dL (ref 6.1–8.1)
eGFR: 99 mL/min/{1.73_m2} (ref >=60)

## 2021-07-06 LAB — CBC WITH DIFFERENTIAL/PLATELET
Absolute Monocytes: 752 cells/uL (ref 200–950)
Basophils Absolute: 40 cells/uL (ref 0–200)
Basophils Relative: 0.6 %
Eosinophils Absolute: 112 cells/uL (ref 15–500)
Eosinophils Relative: 1.7 %
HCT: 57 % — ABNORMAL HIGH (ref 38.5–50.0)
Hemoglobin: 19.7 g/dL — ABNORMAL HIGH (ref 13.2–17.1)
Lymphs Abs: 2765 cells/uL (ref 850–3900)
MCH: 30.6 pg (ref 27.0–33.0)
MCHC: 34.6 g/dL (ref 32.0–36.0)
MCV: 88.5 fL (ref 80.0–100.0)
MPV: 9.9 fL (ref 7.5–12.5)
Monocytes Relative: 11.4 %
Neutro Abs: 2930 cells/uL (ref 1500–7800)
Neutrophils Relative %: 44.4 %
Platelets: 270 10*3/uL (ref 140–400)
RBC: 6.44 10*6/uL — ABNORMAL HIGH (ref 4.20–5.80)
RDW: 13 % (ref 11.0–15.0)
Total Lymphocyte: 41.9 %
WBC: 6.6 10*3/uL (ref 3.8–10.8)

## 2021-07-06 LAB — LIPID PANEL
Cholesterol: 167 mg/dL (ref <200)
HDL: 39 mg/dL — ABNORMAL LOW (ref >=40)
LDL Cholesterol (Calc): 100 mg/dL (calc) — ABNORMAL HIGH
Non-HDL Cholesterol (Calc): 128 mg/dL (calc) (ref <130)
Total CHOL/HDL Ratio: 4.3 (calc) (ref <5.0)
Triglycerides: 182 mg/dL — ABNORMAL HIGH (ref <150)

## 2021-07-06 LAB — HEMOGLOBIN A1C
Hgb A1c MFr Bld: 6 % of total Hgb — ABNORMAL HIGH (ref <5.7)
Mean Plasma Glucose: 126 mg/dL
eAG (mmol/L): 7 mmol/L

## 2021-08-19 ENCOUNTER — Encounter: Payer: Self-pay | Admitting: Oncology

## 2021-09-12 IMAGING — CT CT RENAL STONE PROTOCOL
1 of 2 series · 15 of 32 positions shown, 19 images · non-contrast
Comparison: 08/26/2016.

CLINICAL DATA: Flank pain.

EXAM:
CT ABDOMEN AND PELVIS WITHOUT CONTRAST
TECHNIQUE: Multidetector CT imaging of the abdomen and pelvis was performed
following the standard protocol without IV contrast.

[Series 3: axial st · axial · 0.83mm/px · z∈[-1098,-618]mm · 15 of 106 slices shown, 19 images]
[im 5/106  soft-tissue]
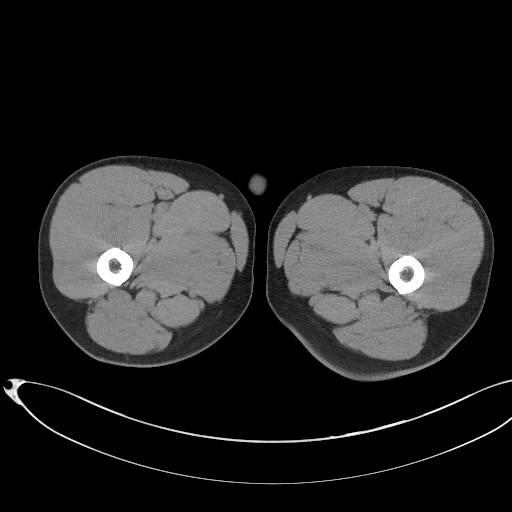
[im 5/106  bone]
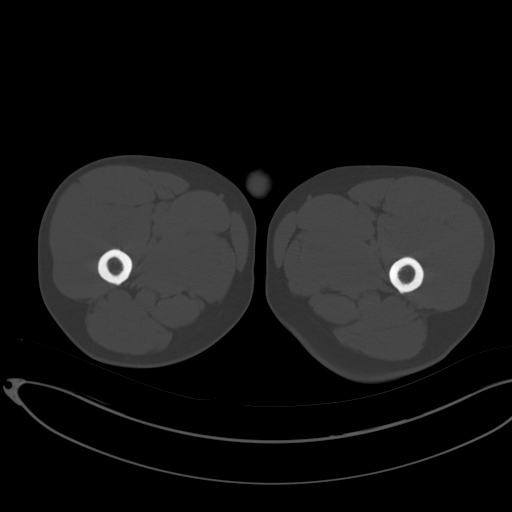
[im 14/106  soft-tissue]
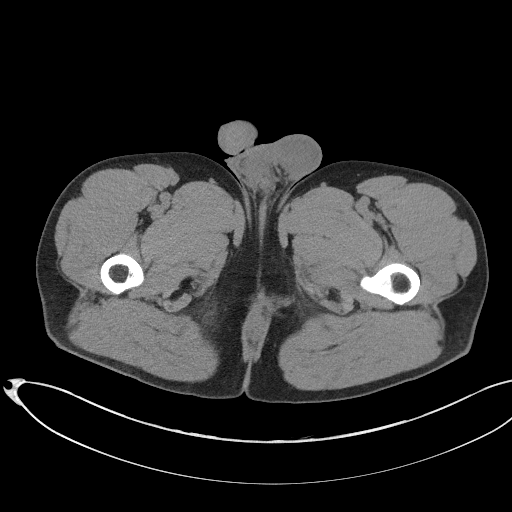
[im 22/106  soft-tissue]
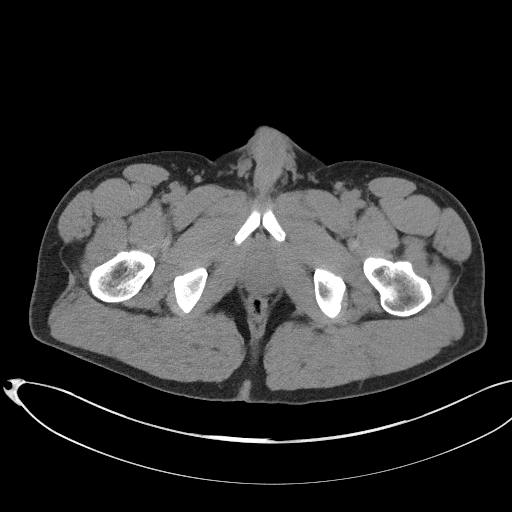
[im 31/106  soft-tissue]
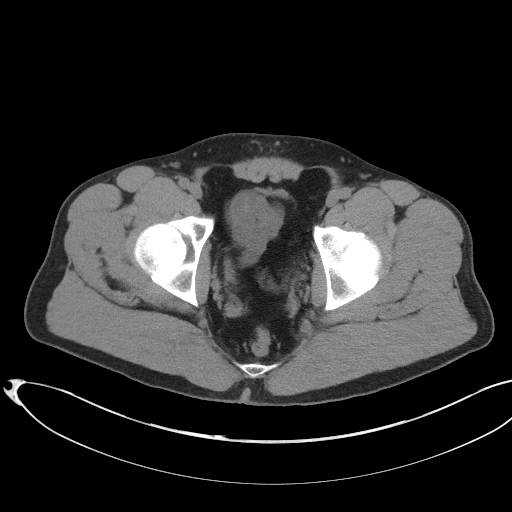
[im 36/106  soft-tissue]
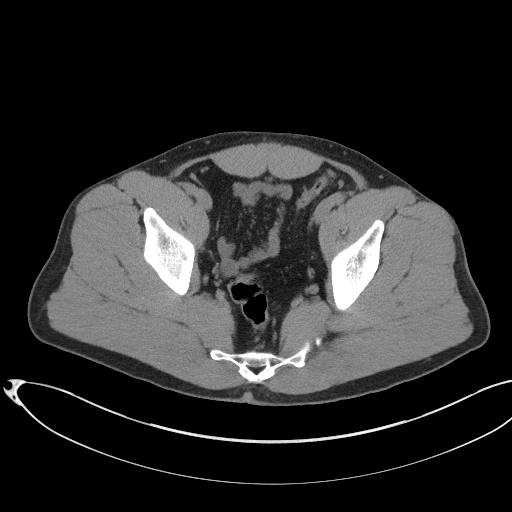
[im 44/106  soft-tissue]
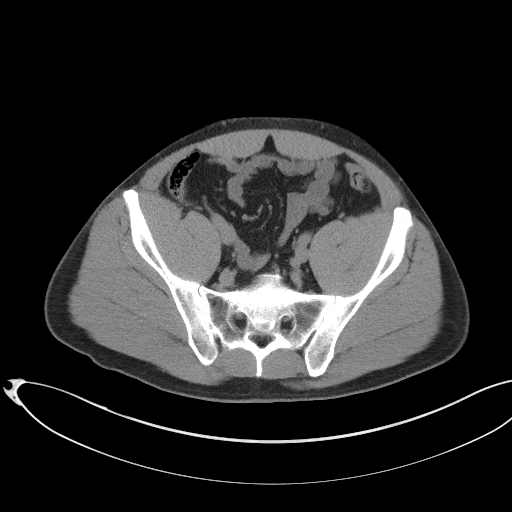
[im 53/106  soft-tissue]
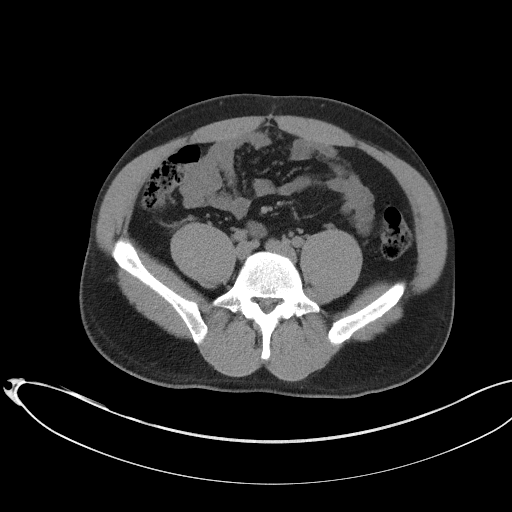
[im 62/106  soft-tissue]
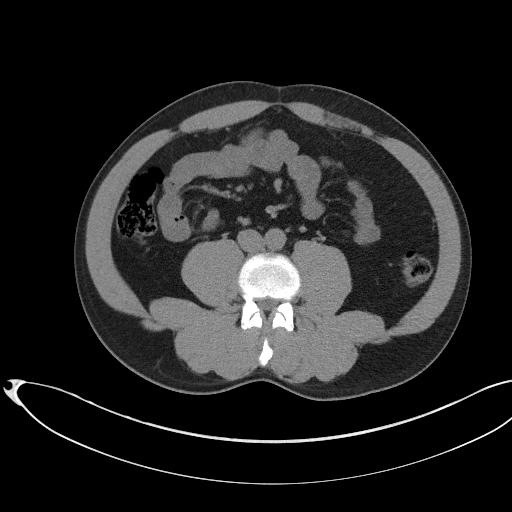
[im 71/106  soft-tissue]
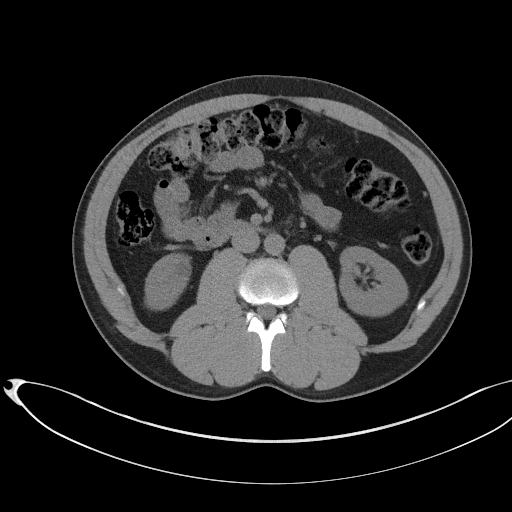
[im 71/106  bone]
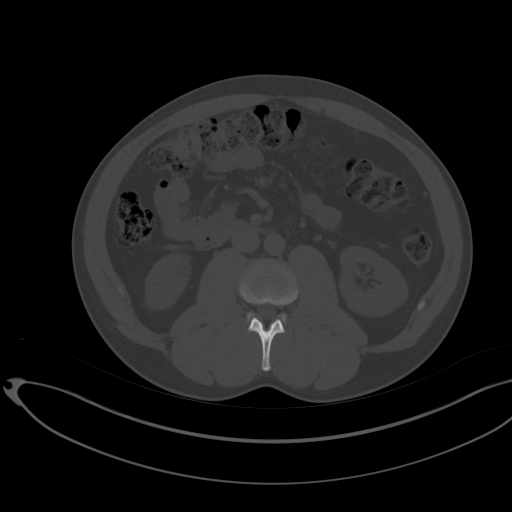
[im 75/106  soft-tissue]
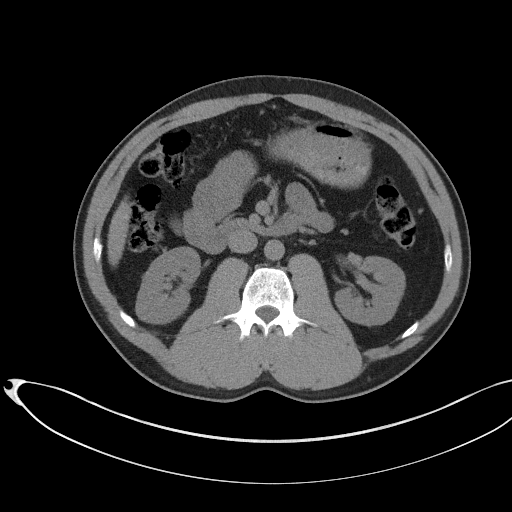
[im 84/106  soft-tissue]
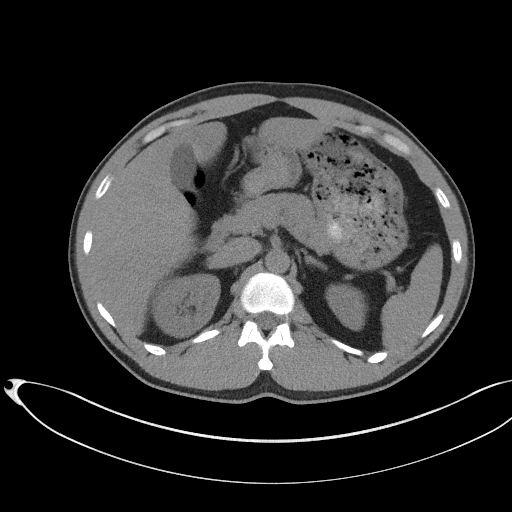
[im 88/106  lung]
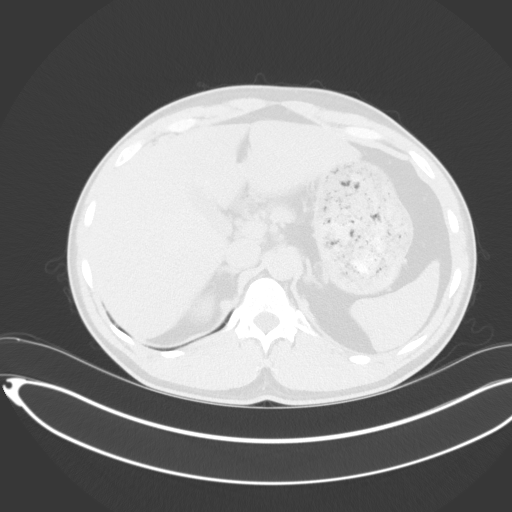
[im 92/106  soft-tissue]
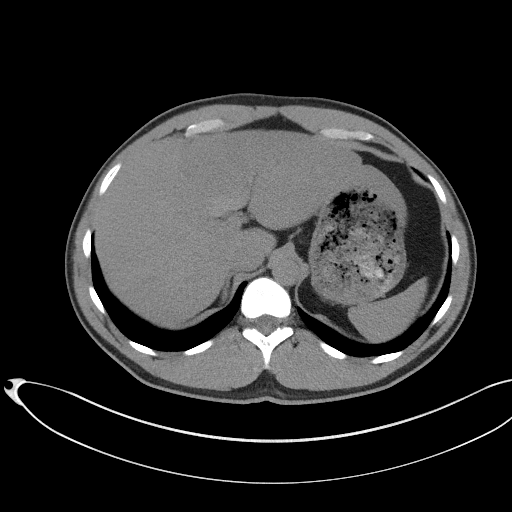
[im 92/106  lung]
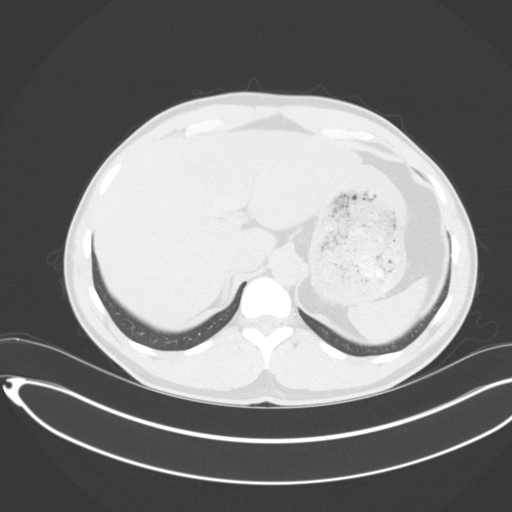
[im 97/106  lung]
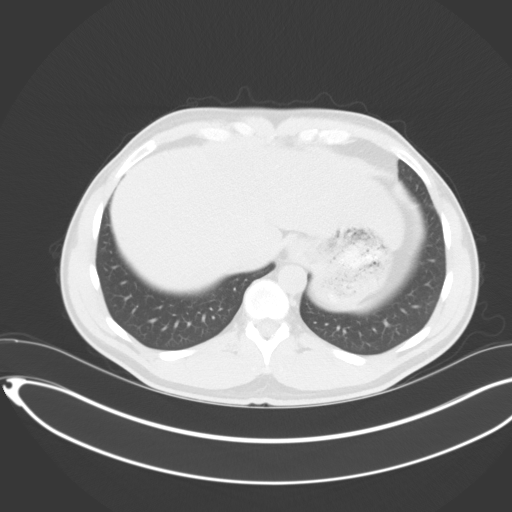
[im 101/106  soft-tissue]
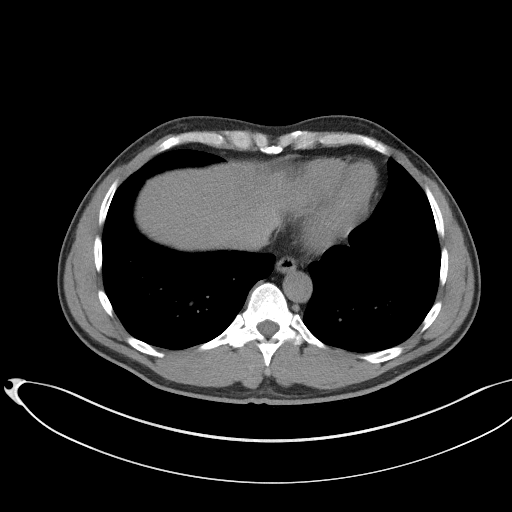
[im 101/106  lung]
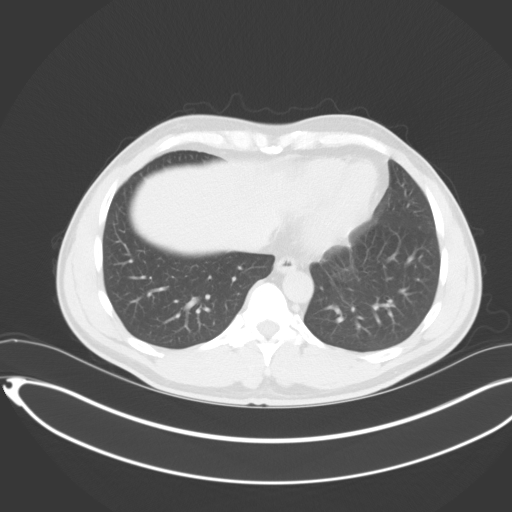

[15 of 32 positions shown; findings below may reference images not displayed]

FINDINGS: Lower chest: Unremarkable.

Hepatobiliary: No focal abnormality in the liver on this study
without intravenous contrast. Low attenuation of liver parenchyma
suggests fatty deposition with areas of sparing along the
gallbladder fossa. There is no evidence for gallstones, gallbladder
wall thickening, or pericholecystic fluid. No intrahepatic or
extrahepatic biliary dilation.

Pancreas: No focal mass lesion. No dilatation of the main duct. No
intraparenchymal cyst. No peripancreatic edema.

Spleen: No splenomegaly. No focal mass lesion.

Adrenals/Urinary Tract: No adrenal nodule or mass. 13 mm water
density lesion lower pole right kidney is compatible with a cyst.
Left kidney unremarkable. No stones in either kidney or ureter. No
secondary changes in either kidney or ureter. Bladder is
decompressed.

Stomach/Bowel: Stomach is moderately distended with food. Duodenum
is normally positioned as is the ligament of Treitz. No small bowel
wall thickening. No small bowel dilatation. The terminal ileum is
normal. The appendix is not well visualized, but there is no edema
or inflammation in the region of the cecum. No gross colonic mass.
No colonic wall thickening.

Vascular/Lymphatic: No abdominal aortic aneurysm. No abdominal
lymphadenopathy no pelvic lymphadenopathy

Reproductive: The prostate gland and seminal vesicles are
unremarkable.

Other: No intraperitoneal free fluid.

Musculoskeletal: No worrisome lytic or sclerotic osseous
abnormality.
IMPRESSION: 1. No acute findings in the abdomen or pelvis. Specifically, no
findings to explain the patient's history of flank pain.
2. Hepatic steatosis.

## 2021-09-23 NOTE — Progress Notes (Unsigned)
Name: Nicolas White   MRN: [id number]    DOB: June 16, 1976   Date:09/23/2021       Progress Note  Subjective  Chief Complaint  Follow Up  HPI  Anger problems: doing much better, sleeping better now, still has hydroxyzine at home but has not taken it a while.    Headaches: he states when it happens is now on top of his head, pressure like , around his head, mild , usually when upset or tired. He states not recent episodes    Intermittent low back pain: he still has medications at home  He states has intermittent stiffness,, usually worse at the end of the day when sitting down. He states it can radiate to abdomen or left groin, also feels in the mornings but improves with activity. No recent problems, not taking any medication lately    Hyperlipidemia: taking crestor, denies  myalgia, continue medications LDL was at goal for him at 92   History of kidney stone: last CT was negative, under the care of Urologist, denies hematuria or flank pain   OSA: also has increase HCT/Hbg, discussed using CPAP machine, he never went to go get it, explained it will help with his HCT. Discussed increase risk of heart attacks and strokes   Pre-diabetes: denies polyphagia, polydipsia or polyuria, last A1C 6 % , unchanged   Patient Active Problem List   Diagnosis Date Noted   Shingles 02/23/2021   OSA (obstructive sleep apnea) 05/27/2019   Erythrocytosis 12/18/2017   Hyperlipidemia 08/27/2015   Displacement of cervical intervertebral disc without myelopathy 08/26/2015   Calculus of kidney 08/26/2015   Cervical radiculitis 08/26/2015   Hypertrophy of tongue papillae 08/26/2015   Allergic rhinitis, seasonal 10/22/2008    No past surgical history on file.  Family History  Problem Relation Age of Onset   Arthritis Mother    Hypertension Mother    Heart attack Mother        hx of light heart attack   Hyperlipidemia Mother    Cancer Father        lung   Kidney disease Brother        had a  transplant (hemodialysis)   Stroke Maternal Grandmother    Heart disease Maternal Grandmother    Kidney disease Maternal Grandmother    Prostate cancer Neg Hx     Social History   Tobacco Use   Smoking status: Never   Smokeless tobacco: Current    Types: Chew   Tobacco comments:    patient has chewed tobacco for 25 years  Substance Use Topics   Alcohol use: No    Alcohol/week: 0.0 standard drinks     Current Outpatient Medications:    cyclobenzaprine (FLEXERIL) 5 MG tablet, Take 1-2 tablets (5-10 mg total) by mouth at bedtime., Disp: 90 tablet, Rfl: 0   hydrOXYzine (ATARAX/VISTARIL) 10 MG tablet, Take 1-2 tablets (10-20 mg total) by mouth at bedtime., Disp: 30 tablet, Rfl: 0   ibuprofen (ADVIL) 800 MG tablet, Take 1 tablet (800 mg total) by mouth every 8 (eight) hours as needed for moderate pain., Disp: 90 tablet, Rfl: 0   rosuvastatin (CRESTOR) 20 MG tablet, Take 1 tablet (20 mg total) by mouth daily., Disp: 90 tablet, Rfl: 1  No Known Allergies  I personally reviewed active problem list, medication list, allergies, family history, social history, health maintenance with the patient/caregiver today.   ROS  ***  Objective  There were no vitals filed for this visit.  There  is no height or weight on file to calculate BMI.  Physical Exam ***  Recent Results (from the past 2160 hour(s))  COMPLETE METABOLIC PANEL WITH GFR     Status: None   Collection Time: 07/05/21  3:30 PM  Result Value Ref Range   Glucose, Bld 80 65 - 99 mg/dL    Comment: .            Fasting reference interval .    BUN 12 7 - 25 mg/dL   Creat 0.96 0.60 - 1.29 mg/dL   eGFR 99 > OR = 60 mL/min/1.74m    Comment: The eGFR is based on the CKD-EPI 2021 equation. To calculate  the new eGFR from a previous Creatinine or Cystatin C result, go to https://www.kidney.org/professionals/ kdoqi/gfr%5Fcalculator    BUN/Creatinine Ratio NOT APPLICABLE 6 - 22 (calc)   Sodium 139 135 - 146 mmol/L    Potassium 3.8 3.5 - 5.3 mmol/L   Chloride 100 98 - 110 mmol/L   CO2 30 20 - 32 mmol/L   Calcium 10.0 8.6 - 10.3 mg/dL   Total Protein 8.0 6.1 - 8.1 g/dL   Albumin 4.9 3.6 - 5.1 g/dL   Globulin 3.1 1.9 - 3.7 g/dL (calc)   AG Ratio 1.6 1.0 - 2.5 (calc)   Total Bilirubin 0.6 0.2 - 1.2 mg/dL   Alkaline phosphatase (APISO) 91 36 - 130 U/L   AST 26 10 - 40 U/L   ALT 37 9 - 46 U/L  CBC w/Diff/Platelet     Status: Abnormal   Collection Time: 07/05/21  3:30 PM  Result Value Ref Range   WBC 6.6 3.8 - 10.8 Thousand/uL   RBC 6.44 (H) 4.20 - 5.80 Million/uL   Hemoglobin 19.7 (H) 13.2 - 17.1 g/dL    Comment: Verified by repeat analysis. .Marland Kitchen   HCT 57.0 (H) 38.5 - 50.0 %   MCV 88.5 80.0 - 100.0 fL   MCH 30.6 27.0 - 33.0 pg   MCHC 34.6 32.0 - 36.0 g/dL   RDW 13.0 11.0 - 15.0 %   Platelets 270 140 - 400 Thousand/uL   MPV 9.9 7.5 - 12.5 fL   Neutro Abs 2,930 1,500 - 7,800 cells/uL   Lymphs Abs 2,765 850 - 3,900 cells/uL   Absolute Monocytes 752 200 - 950 cells/uL   Eosinophils Absolute 112 15 - 500 cells/uL   Basophils Absolute 40 0 - 200 cells/uL   Neutrophils Relative % 44.4 %   Total Lymphocyte 41.9 %   Monocytes Relative 11.4 %   Eosinophils Relative 1.7 %   Basophils Relative 0.6 %  Lipid Profile     Status: Abnormal   Collection Time: 07/05/21  3:30 PM  Result Value Ref Range   Cholesterol 167 <200 mg/dL   HDL 39 (L) > OR = 40 mg/dL   Triglycerides 182 (H) <150 mg/dL   LDL Cholesterol (Calc) 100 (H) mg/dL (calc)    Comment: Reference range: <100 . Desirable range <100 mg/dL for primary prevention;   <70 mg/dL for patients with CHD or diabetic patients  with > or = 2 CHD risk factors. .Marland KitchenLDL-C is now calculated using the Martin-Hopkins  calculation, which is a validated novel method providing  better accuracy than the Friedewald equation in the  estimation of LDL-C.  MCresenciano Genreet al. JAnnamaria Helling [id number];160(10: [id number]  (http://education.[facility].com/faq/FAQ164)    Total  CHOL/HDL Ratio 4.3 <5.0 (calc)   Non-HDL Cholesterol (Calc) 128 <130 mg/dL (calc)    Comment: For patients  with diabetes plus 1 major ASCVD risk  factor, treating to a non-HDL-C goal of <100 mg/dL  (LDL-C of <70 mg/dL) is considered a therapeutic  option.   HgB A1c     Status: Abnormal   Collection Time: 07/05/21  3:30 PM  Result Value Ref Range   Hgb A1c MFr Bld 6.0 (H) <5.7 % of total Hgb    Comment: For someone without known diabetes, a hemoglobin  A1c value between 5.7% and 6.4% is consistent with prediabetes and should be confirmed with a  follow-up test. . For someone with known diabetes, a value <7% indicates that their diabetes is well controlled. A1c targets should be individualized based on duration of diabetes, age, comorbid conditions, and other considerations. . This assay result is consistent with an increased risk of diabetes. . Currently, no consensus exists regarding use of hemoglobin A1c for diagnosis of diabetes for children. .    Mean Plasma Glucose 126 mg/dL   eAG (mmol/L) 7.0 mmol/L    PHQ2/9: Depression screen [facility] 2/9 07/05/2021 03/25/2021 02/23/2021 01/27/2021 09/24/2020  Decreased Interest 0 0 0 0 0  Down, Depressed, Hopeless 0 0 0 0 0  PHQ - 2 Score 0 0 0 0 0  Altered sleeping 0 - - - -  Tired, decreased energy 1 - - - -  Change in appetite 0 - - - -  Feeling bad or failure about yourself  0 - - - -  Trouble concentrating 0 - - - -  Moving slowly or fidgety/restless 0 - - - -  Suicidal thoughts 0 - - - -  PHQ-9 Score 1 - - - -  Difficult doing work/chores - - - - -  Some recent data might be hidden    phq 9 is {gen pos [id number]}   Fall Risk: Fall Risk  07/05/2021 03/25/2021 02/23/2021 01/27/2021 09/24/2020  Falls in the past year? 0 0 0 0 0  Number falls in past yr: 0 0 0 0 0  Injury with Fall? 0 0 0 0 0  Risk for fall due to : No Fall Risks - - - -  Follow up Falls prevention discussed Falls evaluation completed Falls evaluation completed  Falls evaluation completed -      Functional Status Survey:      Assessment & Plan  *** There are no diagnoses linked to this encounter.

## 2021-09-26 ENCOUNTER — Ambulatory Visit: Payer: BC Managed Care – PPO | Admitting: Family Medicine

## 2021-09-26 ENCOUNTER — Other Ambulatory Visit: Payer: Self-pay | Admitting: Family Medicine

## 2021-09-26 DIAGNOSIS — E7800 Pure hypercholesterolemia, unspecified: Secondary | ICD-10-CM

## 2021-09-26 DIAGNOSIS — Z1211 Encounter for screening for malignant neoplasm of colon: Secondary | ICD-10-CM

## 2021-09-26 NOTE — Telephone Encounter (Signed)
Pt called in stating the pharmacy told him his medication rosuvastatin (CRESTOR) 20 MG tablet  needs a PA. Please advise

## 2021-09-27 ENCOUNTER — Other Ambulatory Visit: Payer: Self-pay

## 2021-09-27 NOTE — Telephone Encounter (Signed)
Requested medication (s) are due for refill today - yes  Requested medication (s) are on the active medication list -yes  Future visit scheduled -yes  Last refill: 03/25/21 #90 1RF  Notes to clinic: Request RF: Patient advised by pharmacy- Rx needs PA- sent to office for review   Requested Prescriptions  Pending Prescriptions Disp Refills   rosuvastatin (CRESTOR) 20 MG tablet [Pharmacy Med Name: Rosuvastatin Calcium 20 MG Oral Tablet] 90 tablet 0    Sig: Take 1 tablet by mouth once daily     Cardiovascular:  Antilipid - Statins 2 Failed - 09/26/2021 10:34 AM      Failed - Lipid Panel in normal range within the last 12 months    Cholesterol, Total  Date Value Ref Range Status  08/26/2015 283 (H) 100 - 199 mg/dL Final   Cholesterol  Date Value Ref Range Status  07/05/2021 167 <200 mg/dL Final   LDL Cholesterol (Calc)  Date Value Ref Range Status  07/05/2021 100 (H) mg/dL (calc) Final    Comment:    Reference range: <100 . Desirable range <100 mg/dL for primary prevention;   <70 mg/dL for patients with CHD or diabetic patients  with > or = 2 CHD risk factors. Marland Kitchen LDL-C is now calculated using the Martin-Hopkins  calculation, which is a validated novel method providing  better accuracy than the Friedewald equation in the  estimation of LDL-C.  Horald Pollen et al. Lenox Ahr. 5188;416(60): 2061-2068  (http://education.[facility].com/faq/FAQ164)    HDL  Date Value Ref Range Status  07/05/2021 39 (L) > OR = 40 mg/dL Final  63/08/6008 40 >93 mg/dL Final   Triglycerides  Date Value Ref Range Status  07/05/2021 182 (H) <150 mg/dL Final         Passed - Cr in normal range and within 360 days    Creat  Date Value Ref Range Status  07/05/2021 0.96 0.60 - 1.29 mg/dL Final          Passed - Patient is not pregnant      Passed - Valid encounter within last 12 months    Recent Outpatient Visits           2 months ago Well adult exam   [facility]  [place], Danna Hefty, MD   6 months ago Intermittent low back pain   [facility] [place], Danna Hefty, MD   7 months ago Herpes zoster without complication   [facility] [place] Dense M, DO   8 months ago Viral upper respiratory tract infection   [facility] [place], Elnita Maxwell, NP   1 year ago Intermittent low back pain   [facility] [place], Danna Hefty, MD       Future Appointments             In 4 weeks Alba Cory, MD [facility]   In 9 months Alba Cory, MD [facility], [facility]               Requested Prescriptions  Pending Prescriptions Disp Refills   rosuvastatin (CRESTOR) 20 MG tablet [Pharmacy Med Name: Rosuvastatin Calcium 20 MG Oral Tablet] 90 tablet 0    Sig: Take 1 tablet by mouth once daily     Cardiovascular:  Antilipid - Statins 2 Failed - 09/26/2021 10:34 AM      Failed - Lipid Panel in normal range within the last 12 months    Cholesterol, Total  Date Value Ref  Range Status  08/26/2015 283 (H) 100 - 199 mg/dL Final   Cholesterol  Date Value Ref Range Status  07/05/2021 167 <200 mg/dL Final   LDL Cholesterol (Calc)  Date Value Ref Range Status  07/05/2021 100 (H) mg/dL (calc) Final    Comment:    Reference range: <100 . Desirable range <100 mg/dL for primary prevention;   <70 mg/dL for patients with CHD or diabetic patients  with > or = 2 CHD risk factors. Marland Kitchen LDL-C is now calculated using the Martin-Hopkins  calculation, which is a validated novel method providing  better accuracy than the Friedewald equation in the  estimation of LDL-C.  Horald Pollen et al. Lenox Ahr. 1324;401(02): 2061-2068  (http://education.[facility].com/faq/FAQ164)    HDL  Date Value Ref Range Status  07/05/2021 39 (L) > OR = 40 mg/dL Final  72/53/6644 40 >03 mg/dL Final   Triglycerides  Date Value Ref Range Status  07/05/2021 182 (H) <150 mg/dL Final          Passed - Cr in normal range and within 360 days    Creat  Date Value Ref Range Status  07/05/2021 0.96 0.60 - 1.29 mg/dL Final          Passed - Patient is not pregnant      Passed - Valid encounter within last 12 months    Recent Outpatient Visits           2 months ago Well adult exam   [facility] [place], Danna Hefty, MD   6 months ago Intermittent low back pain   [facility] Alba Cory, MD   7 months ago Herpes zoster without complication   [facility] [place] Dense M, DO   8 months ago Viral upper respiratory tract infection   [facility] [place] Cirri, NP   1 year ago Intermittent low back pain   [facility] Alba Cory, MD       Future Appointments             In 4 weeks Alba Cory, MD [facility]   In 9 months Alba Cory, MD [facility], [facility]

## 2021-09-30 ENCOUNTER — Ambulatory Visit: Payer: BC Managed Care – PPO | Admitting: Family Medicine

## 2021-10-09 ENCOUNTER — Other Ambulatory Visit: Payer: Self-pay | Admitting: Family Medicine

## 2021-10-09 DIAGNOSIS — M545 Low back pain, unspecified: Secondary | ICD-10-CM

## 2021-10-10 ENCOUNTER — Other Ambulatory Visit: Payer: Self-pay | Admitting: Family Medicine

## 2021-10-10 DIAGNOSIS — M545 Low back pain, unspecified: Secondary | ICD-10-CM

## 2021-10-10 MED ORDER — IBUPROFEN 800 MG PO TABS: 800.0000 mg | ORAL_TABLET | Freq: Three times a day (TID) | ORAL | 0 refills | Status: DC | PRN

## 2021-10-24 NOTE — Progress Notes (Signed)
Name: Nicolas White   MRN: [id number]    DOB: [birth year]   Date:10/25/2021 ? ?     Progress Note ? ?Subjective ? ?Chief Complaint ? ?Follow Up ? ?HPI ? ?Anger problems: doing much better, sleeping better now, still has hydroxyzine at home but states not taking it lately.  ?  ?Intermittent low back pain: he still has medications at home  He states has intermittent stiffness,, usually worse at the end of the day when sitting down for a while. No longer having radiculitis( used to radiate to abdomen or left groin) . He is out of flexeril and we will send a refill.  ?  ?Hyperlipidemia: taking crestor, denies  myalgia, continue medications LDL was at goal for him at 92 , we will recheck it yearly ? ?History of kidney stone: last CT was negative, under the care of Urologist, denies hematuria , dysuria or flank pain . He states when he notices dysuria he drinks cranberry juice and increases water intake and it resolves, no episodes lately  ? ?OSA: also has increase HCT/Hbg, he still has not started wearing his CPAP machine.  ? ?Pre-diabetes: denies polyphagia, polydipsia or polyuria, last A1C 6 % . We will continue to monitor  ? ?Patient Active Problem List  ? Diagnosis Date Noted  ? Shingles 02/23/2021  ? OSA (obstructive sleep apnea) 05/27/2019  ? Erythrocytosis 12/18/2017  ? Hyperlipidemia 08/27/2015  ? Displacement of cervical intervertebral disc without myelopathy 08/26/2015  ? Calculus of kidney 08/26/2015  ? Cervical radiculitis 08/26/2015  ? Hypertrophy of tongue papillae 08/26/2015  ? Allergic rhinitis, seasonal 10/22/2008  ? ? ?No past surgical history on file. ? ?Family History  ?Problem Relation Age of Onset  ? Arthritis Mother   ? Hypertension Mother   ? Heart attack Mother   ?     hx of light heart attack  ? Hyperlipidemia Mother   ? Cancer Father   ?     lung  ? Kidney disease Brother   ?     had a transplant (hemodialysis)  ? Stroke Maternal Grandmother   ? Heart disease Maternal Grandmother   ? Kidney  disease Maternal Grandmother   ? Prostate cancer Neg Hx   ? ? ?Social History  ? ?Tobacco Use  ? Smoking status: Never  ? Smokeless tobacco: Current  ?  Types: Chew  ? Tobacco comments:  ?  patient has chewed tobacco for 25 years  ?Substance Use Topics  ? Alcohol use: No  ?  Alcohol/week: 0.0 standard drinks  ? ? ? ?Current Outpatient Medications:  ?  cyclobenzaprine (FLEXERIL) 5 MG tablet, Take 1-2 tablets (5-10 mg total) by mouth at bedtime., Disp: 90 tablet, Rfl: 0 ?  hydrOXYzine (ATARAX/VISTARIL) 10 MG tablet, Take 1-2 tablets (10-20 mg total) by mouth at bedtime., Disp: 30 tablet, Rfl: 0 ?  ibuprofen (ADVIL) 800 MG tablet, Take 1 tablet (800 mg total) by mouth every 8 (eight) hours as needed for moderate pain., Disp: 90 tablet, Rfl: 0 ?  rosuvastatin (CRESTOR) 20 MG tablet, Take 1 tablet by mouth once daily, Disp: 90 tablet, Rfl: 0 ? ?No Known Allergies ? ?I personally reviewed active problem list, medication list, allergies, family history, social history, health maintenance with the patient/caregiver today. ? ? ?ROS ? ?Constitutional: Negative for fever or weight change.  ?Respiratory: Negative for cough and shortness of breath.   ?Cardiovascular: Negative for chest pain or palpitations.  ?Gastrointestinal: Negative for abdominal pain, no bowel changes.  ?Musculoskeletal:  Negative for gait problem or joint swelling.  ?Skin: Negative for rash.  ?Neurological: Negative for dizziness or headache.  ?No other specific complaints in a complete review of systems (except as listed in HPI above).  ? ?Objective ? ?Vitals:  ? 10/25/21 1115  ?BP: 132/78  ?Pulse: 91  ?Resp: 16  ?SpO2: 97%  ?Weight: 186 lb (84.4 kg)  ?Height: 5\' 10"  (1.778 m)  ? ? ?Body mass index is 26.69 kg/m?. ? ?Physical Exam ? ?Constitutional: Patient appears well-developed and well-nourished.  No distress.  ?HEENT: head atraumatic, normocephalic, pupils equal and reactive to light, neck supple ?Cardiovascular: Normal rate, regular rhythm and normal  heart sounds.  No murmur heard. No BLE edema. ?Pulmonary/Chest: Effort normal and breath sounds normal. No respiratory distress. ?Abdominal: Soft.  There is no tenderness. ?Psychiatric: Patient has a normal mood and affect. behavior is normal. Judgment and thought content normal.  ? ?PHQ2/9: ?Depression screen [facility] 2/9 10/25/2021 07/05/2021 03/25/2021 02/23/2021 01/27/2021  ?Decreased Interest 0 0 0 0 0  ?Down, Depressed, Hopeless 0 0 0 0 0  ?PHQ - 2 Score 0 0 0 0 0  ?Altered sleeping 0 0 - - -  ?Tired, decreased energy 0 1 - - -  ?Change in appetite 0 0 - - -  ?Feeling bad or failure about yourself  0 0 - - -  ?Trouble concentrating 0 0 - - -  ?Moving slowly or fidgety/restless 0 0 - - -  ?Suicidal thoughts 0 0 - - -  ?PHQ-9 Score 0 1 - - -  ?Difficult doing work/chores - - - - -  ?Some recent data might be hidden  ?  ?phq 9 is negative ? ? ?Fall Risk: ?Fall Risk  10/25/2021 07/05/2021 03/25/2021 02/23/2021 01/27/2021  ?Falls in the past year? 0 0 0 0 0  ?Number falls in past yr: 0 0 0 0 0  ?Injury with Fall? 0 0 0 0 0  ?Risk for fall due to : No Fall Risks No Fall Risks - - -  ?Follow up Falls prevention discussed Falls prevention discussed Falls evaluation completed Falls evaluation completed Falls evaluation completed  ? ? ? ? ?Functional Status Survey: ?Is the patient deaf or have difficulty hearing?: No ?Does the patient have difficulty seeing, even when wearing glasses/contacts?: No ?Does the patient have difficulty concentrating, remembering, or making decisions?: No ?Does the patient have difficulty walking or climbing stairs?: No ?Does the patient have difficulty dressing or bathing?: No ?Does the patient have difficulty doing errands alone such as visiting a doctor's office or shopping?: No ? ? ? ?Assessment & Plan ? ?1. Intermittent low back pain ? ?- cyclobenzaprine (FLEXERIL) 5 MG tablet; Take 1-2 tablets (5-10 mg total) by mouth at bedtime.  Dispense: 90 tablet; Refill: 0 ? ?2. Pure hypercholesterolemia ? ?-  rosuvastatin (CRESTOR) 20 MG tablet; Take 1 tablet (20 mg total) by mouth daily.  Dispense: 90 tablet; Refill: 1 ? ?3. OSA (obstructive sleep apnea) ? ? ?4. Pre-diabetes ? ? ?5. Erythrocytosis ? ? ?6. Anxiety and depression ? ? ?7. Anger reaction ? ?Doing well at this time  ?

## 2021-10-25 ENCOUNTER — Encounter: Payer: Self-pay | Admitting: Family Medicine

## 2021-10-25 ENCOUNTER — Other Ambulatory Visit: Payer: Self-pay

## 2021-10-25 ENCOUNTER — Ambulatory Visit: Payer: BC Managed Care – PPO | Admitting: Family Medicine

## 2021-10-25 VITALS — BP 132/78 | HR 91 | Resp 16 | Ht 70.0 in | Wt 186.0 lb

## 2021-10-25 DIAGNOSIS — R7303 Prediabetes: Secondary | ICD-10-CM | POA: Diagnosis not present

## 2021-10-25 DIAGNOSIS — D751 Secondary polycythemia: Secondary | ICD-10-CM

## 2021-10-25 DIAGNOSIS — E7800 Pure hypercholesterolemia, unspecified: Secondary | ICD-10-CM | POA: Diagnosis not present

## 2021-10-25 DIAGNOSIS — G4733 Obstructive sleep apnea (adult) (pediatric): Secondary | ICD-10-CM | POA: Diagnosis not present

## 2021-10-25 DIAGNOSIS — M545 Low back pain, unspecified: Secondary | ICD-10-CM

## 2021-10-25 DIAGNOSIS — F419 Anxiety disorder, unspecified: Secondary | ICD-10-CM

## 2021-10-25 DIAGNOSIS — F32A Depression, unspecified: Secondary | ICD-10-CM

## 2021-10-25 DIAGNOSIS — R454 Irritability and anger: Secondary | ICD-10-CM

## 2021-10-25 MED ORDER — ROSUVASTATIN CALCIUM 20 MG PO TABS: 20.0000 mg | ORAL_TABLET | Freq: Every day | ORAL | 1 refills | Status: DC

## 2021-10-25 MED ORDER — CYCLOBENZAPRINE HCL 5 MG PO TABS: 5.0000 mg | ORAL_TABLET | Freq: Every day | ORAL | 0 refills | Status: DC

## 2021-11-08 ENCOUNTER — Encounter: Payer: Self-pay | Admitting: Family Medicine

## 2021-12-05 ENCOUNTER — Ambulatory Visit: Payer: BC Managed Care – PPO | Admitting: Nurse Practitioner

## 2021-12-05 ENCOUNTER — Other Ambulatory Visit: Payer: Self-pay

## 2021-12-05 ENCOUNTER — Encounter: Payer: Self-pay | Admitting: Nurse Practitioner

## 2021-12-05 ENCOUNTER — Ambulatory Visit: Payer: Self-pay | Admitting: *Deleted

## 2021-12-05 VITALS — BP 128/76 | HR 93 | Temp 98.1°F | Resp 16 | Ht 70.0 in | Wt 183.8 lb

## 2021-12-05 DIAGNOSIS — M6240 Contracture of muscle, unspecified site: Secondary | ICD-10-CM | POA: Diagnosis not present

## 2021-12-05 MED ORDER — NAPROXEN 500 MG PO TABS: 500.0000 mg | ORAL_TABLET | Freq: Two times a day (BID) | ORAL | 0 refills | Status: DC

## 2021-12-05 MED ORDER — METHOCARBAMOL 500 MG PO TABS
500.0000 mg | ORAL_TABLET | Freq: Two times a day (BID) | ORAL | 0 refills | Status: DC | PRN
Start: 2021-12-05 — End: 2022-07-14

## 2021-12-05 NOTE — Progress Notes (Signed)
? ?BP 128/76   Pulse 93   Temp 98.1 ?F (36.7 ?C) (Oral)   Resp 16   Ht 5' 10" (1.778 m)   Wt 183 lb 12.8 oz (83.4 kg)   SpO2 98%   BMI 26.37 kg/m?   ? ?Subjective:  ? ? Patient ID: Nicolas White, male    DOB: 1975-10-17, 46 y.o.   MRN: [id number] ? ?HPI: ?Nicolas White is a 46 y.o. male ? ?No chief complaint on file. ? ?Muscle spasm: Patient reports that on Saturday he was lifting 8 foot pieces of plywood.  He says on Sunday he started having left pectoral twitching.  He denies any chest pain or shortness of breath.  Upon inspection left pectoral muscle is twitching.  Discussed case with Dr. Ancil Boozer.  We will treat for muscle spasm with muscle relaxer and NSAID.  Patient is agreeable to plan. ? ?Relevant past medical, surgical, family and social history reviewed and updated as indicated. Interim medical history since our last visit reviewed. ?Allergies and medications reviewed and updated. ? ?Review of Systems ? ?Constitutional: Negative for fever or weight change.  ?Respiratory: Negative for cough and shortness of breath.   ?Cardiovascular: Negative for chest pain or palpitations.  ?Gastrointestinal: Negative for abdominal pain, no bowel changes.  ?Musculoskeletal: Negative for gait problem or joint swelling.  Positive for muscle twitching on left pectoral muscle ?Skin: Negative for rash.  ?Neurological: Negative for dizziness or headache.  ?No other specific complaints in a complete review of systems (except as listed in HPI above).  ? ?   ?Objective:  ?  ?BP 128/76   Pulse 93   Temp 98.1 ?F (36.7 ?C) (Oral)   Resp 16   Ht 5' 10" (1.778 m)   Wt 183 lb 12.8 oz (83.4 kg)   SpO2 98%   BMI 26.37 kg/m?   ?Wt Readings from Last 3 Encounters:  ?12/05/21 183 lb 12.8 oz (83.4 kg)  ?10/25/21 186 lb (84.4 kg)  ?07/05/21 187 lb (84.8 kg)  ?  ?Physical Exam ? ?Constitutional: Patient appears well-developed and well-nourished.  No distress.  ?HEENT: head atraumatic, normocephalic, pupils equal and reactive to  light,  neck supple ?Cardiovascular: Normal rate, regular rhythm and normal heart sounds.  No murmur heard. No BLE edema. ?Pulmonary/Chest: Effort normal and breath sounds normal. No respiratory distress. ?Abdominal: Soft.  There is no tenderness. ?MSK: Muscle twitching of the left pectoral muscle noted, no tenderness ?Psychiatric: Patient has a normal mood and affect. behavior is normal. Judgment and thought content normal.  ?Results for orders placed or performed in visit on 07/05/21  ?COMPLETE METABOLIC PANEL WITH GFR  ?Result Value Ref Range  ? Glucose, Bld 80 65 - 99 mg/dL  ? BUN 12 7 - 25 mg/dL  ? Creat 0.96 0.60 - 1.29 mg/dL  ? eGFR 99 > OR = 60 mL/min/1.43m  ? BUN/Creatinine Ratio NOT APPLICABLE 6 - 22 (calc)  ? Sodium 139 135 - 146 mmol/L  ? Potassium 3.8 3.5 - 5.3 mmol/L  ? Chloride 100 98 - 110 mmol/L  ? CO2 30 20 - 32 mmol/L  ? Calcium 10.0 8.6 - 10.3 mg/dL  ? Total Protein 8.0 6.1 - 8.1 g/dL  ? Albumin 4.9 3.6 - 5.1 g/dL  ? Globulin 3.1 1.9 - 3.7 g/dL (calc)  ? AG Ratio 1.6 1.0 - 2.5 (calc)  ? Total Bilirubin 0.6 0.2 - 1.2 mg/dL  ? Alkaline phosphatase (APISO) 91 36 - 130 U/L  ? AST 26  10 - 40 U/L  ? ALT 37 9 - 46 U/L  ?CBC w/Diff/Platelet  ?Result Value Ref Range  ? WBC 6.6 3.8 - 10.8 Thousand/uL  ? RBC 6.44 (H) 4.20 - 5.80 Million/uL  ? Hemoglobin 19.7 (H) 13.2 - 17.1 g/dL  ? HCT 57.0 (H) 38.5 - 50.0 %  ? MCV 88.5 80.0 - 100.0 fL  ? MCH 30.6 27.0 - 33.0 pg  ? MCHC 34.6 32.0 - 36.0 g/dL  ? RDW 13.0 11.0 - 15.0 %  ? Platelets 270 140 - 400 Thousand/uL  ? MPV 9.9 7.5 - 12.5 fL  ? Neutro Abs 2,930 1,500 - 7,800 cells/uL  ? Lymphs Abs 2,765 850 - 3,900 cells/uL  ? Absolute Monocytes 752 200 - 950 cells/uL  ? Eosinophils Absolute 112 15 - 500 cells/uL  ? Basophils Absolute 40 0 - 200 cells/uL  ? Neutrophils Relative % 44.4 %  ? Total Lymphocyte 41.9 %  ? Monocytes Relative 11.4 %  ? Eosinophils Relative 1.7 %  ? Basophils Relative 0.6 %  ?Lipid Profile  ?Result Value Ref Range  ? Cholesterol 167 <200 mg/dL   ? HDL 39 (L) > OR = 40 mg/dL  ? Triglycerides 182 (H) <150 mg/dL  ? LDL Cholesterol (Calc) 100 (H) mg/dL (calc)  ? Total CHOL/HDL Ratio 4.3 <5.0 (calc)  ? Non-HDL Cholesterol (Calc) 128 <130 mg/dL (calc)  ?HgB A1c  ?Result Value Ref Range  ? Hgb A1c MFr Bld 6.0 (H) <5.7 % of total Hgb  ? Mean Plasma Glucose 126 mg/dL  ? eAG (mmol/L) 7.0 mmol/L  ? ?   ?Assessment & Plan:  ? ?1. Intractable muscle spasm ? ?- methocarbamol (ROBAXIN) 500 MG tablet; Take 1 tablet (500 mg total) by mouth 2 (two) times daily as needed for muscle spasms.  Dispense: 20 tablet; Refill: 0 ?- naproxen (NAPROSYN) 500 MG tablet; Take 1 tablet (500 mg total) by mouth 2 (two) times daily with a meal.  Dispense: 20 tablet; Refill: 0  ? ?Follow up plan: ?Return if symptoms worsen or fail to improve. ? ? ? ? ? ?

## 2021-12-05 NOTE — Telephone Encounter (Signed)
?  Chief Complaint: left chest pain requesting appt  ?Symptoms: left chest pain since lifting heavy object on Saturday feels like muscle beating under skin   ?Frequency: 3 days  ?Pertinent Negatives: Patient denies chest pressure, no difficulty breathing , no N/V no sweating, no dizziness. ?Disposition: [] ED /[] Urgent Care (no appt availability in office) / [x] Appointment(In office/virtual)/ []  [facility]/ [] Home Care/ [] Refused Recommended Disposition /[] [facility]/ []  Follow-up with PCP ?Additional Notes:  ? ?Na ? ? ? Reason for Disposition ? [1] Chest pain lasts < 5 minutes AND [2] NO chest pain or cardiac symptoms (e.g., breathing difficulty, sweating) now (Exception: chest pains that last only a few seconds) ? ?Answer Assessment - Initial Assessment Questions ?1. LOCATION: "Where does it hurt?"   ?    Left chest  ?2. RADIATION: "Does the pain go anywhere else?" (e.g., into neck, jaw, arms, back) ?    No  ?3. ONSET: "When did the chest pain begin?" (Minutes, hours or days)  ?    Yesterday  ?4. PATTERN "Does the pain come and go, or has it been constant since it started?"  "Does it get worse with exertion?"  ?    Comes and goes  ?5. DURATION: "How long does it last" (e.g., seconds, minutes, hours) ?    Feels like under skin  ?6. SEVERITY: "How bad is the pain?"  (e.g., Scale 1-10; mild, moderate, or severe) ?   - MILD (1-3): doesn't interfere with normal activities  ?   - MODERATE (4-7): interferes with normal activities or awakens from sleep ?   - SEVERE (8-10): excruciating pain, unable to do any normal activities   ?    Can do normal activities ?7. CARDIAC RISK FACTORS: "Do you have any history of heart problems or risk factors for heart disease?" (e.g., angina, prior heart attack; diabetes, high blood pressure, high cholesterol, smoker, or strong family history of heart disease) ?    See Hx  ?8. PULMONARY RISK FACTORS: "Do you have any history of lung disease?"  (e.g., blood clots in  lung, asthma, emphysema, birth control pills) ?    na ?9. CAUSE: "What do you think is causing the chest pain?" ?    Not sure lifted heavy object Saturday  ?10. OTHER SYMPTOMS: "Do you have any other symptoms?" (e.g., dizziness, nausea, vomiting, sweating, fever, difficulty breathing, cough) ?      Denies  ?11. PREGNANCY: "Is there any chance you are pregnant?" "When was your last menstrual period?" ?      na ? ?Protocols used: Chest Pain-A-AH ? ?

## 2022-05-22 ENCOUNTER — Ambulatory Visit: Payer: BC Managed Care – PPO | Admitting: Family Medicine

## 2022-07-13 NOTE — Progress Notes (Signed)
Name: Nicolas White   MRN: [id number]    DOB: 01-Jan-1976   Date:07/14/2022       Progress Note  Subjective  Chief Complaint  Annual Exam  HPI  Patient presents for annual CPE.  IPSS Questionnaire (AUA-7): Over the past month.   1)  How often have you had a sensation of not emptying your bladder completely after you finish urinating?  0 - Not at all  2)  How often have you had to urinate again less than two hours after you finished urinating? 0 - Not at all  3)  How often have you found you stopped and started again several times when you urinated?  0 - Not at all  4) How difficult have you found it to postpone urination?  0 - Not at all  5) How often have you had a weak urinary stream?  0 - Not at all  6) How often have you had to push or strain to begin urination?  0 - Not at all  7) How many times did you most typically get up to urinate from the time you went to bed until the time you got up in the morning?  0 - None  Total score:  0-7 mildly symptomatic   8-19 moderately symptomatic   20-35 severely symptomatic     Diet: eats at home most of the time Exercise: discussed 150 minutes per week  Last Dental Exam: due for eye exam  Last Eye Exam: up date   Depression: phq 9 is negative    07/14/2022    3:42 PM 12/05/2021    3:42 PM 10/25/2021   11:15 AM 07/05/2021    2:45 PM 03/25/2021    1:23 PM  Depression screen PHQ 2/9  Decreased Interest 0 0 0 0 0  Down, Depressed, Hopeless 0 0 0 0 0  PHQ - 2 Score 0 0 0 0 0  Altered sleeping 0  0 0   Tired, decreased energy 0  0 1   Change in appetite 0  0 0   Feeling bad or failure about yourself  0  0 0   Trouble concentrating 0  0 0   Moving slowly or fidgety/restless 0  0 0   Suicidal thoughts 0  0 0   PHQ-9 Score 0  0 1     Hypertension:  BP Readings from Last 3 Encounters:  07/14/22 118/78  12/05/21 128/76  10/25/21 132/78    Obesity: Wt Readings from Last 3 Encounters:  07/14/22 187 lb (84.8 kg)  12/05/21 183 lb  12.8 oz (83.4 kg)  10/25/21 186 lb (84.4 kg)   BMI Readings from Last 3 Encounters:  07/14/22 26.83 kg/m  12/05/21 26.37 kg/m  10/25/21 26.69 kg/m     Lipids:  Lab Results  Component Value Date   CHOL 167 07/05/2021   CHOL 153 06/24/2020   CHOL 129 05/26/2019   Lab Results  Component Value Date   HDL 39 (L) 07/05/2021   HDL 41 06/24/2020   HDL 46 05/26/2019   Lab Results  Component Value Date   LDLCALC 100 (H) 07/05/2021   LDLCALC 92 06/24/2020   LDLCALC 70 05/26/2019   Lab Results  Component Value Date   TRIG 182 (H) 07/05/2021   TRIG 103 06/24/2020   TRIG 51 05/26/2019   Lab Results  Component Value Date   CHOLHDL 4.3 07/05/2021   CHOLHDL 3.7 06/24/2020   CHOLHDL 2.8 05/26/2019   No results  found for: "LDLDIRECT" Glucose:  Glucose, Bld  Date Value Ref Range Status  07/05/2021 80 65 - 99 mg/dL Final    Comment:    .            Fasting reference interval .   06/24/2020 86 65 - 99 mg/dL Final    Comment:    .            Fasting reference interval .   05/26/2019 82 65 - 99 mg/dL Final    Comment:    .            Fasting reference interval .     [place] Office Visit from 12/05/2021 in [facility]  AUDIT-C Score 0      Married STD testing and prevention (HIV/chl/gon/syphilis): N/A Sexual history: no problems  Hep C Screening: 06/24/20 Skin cancer: Discussed monitoring for atypical lesions Colorectal cancer: N/A Prostate cancer:  discussed usptf but he wants to have PSA done   Lung cancer:  Low Dose CT Chest recommended if Age 35-80 years, 30 pack-year currently smoking OR have quit w/in 15years. Patient  no a candidate for screening   AAA: The USPSTF recommends one-time screening with ultrasonography in men ages 30 to 18 years who have ever smoked. Patient   no, a candidate for screening  ECG:  05/26/19  Vaccines:   Tdap: up to date Shingrix: N/A Pneumonia: N/A Flu: today  COVID-19: discussed booster    Advanced Care Planning: A voluntary discussion about advance care planning including the explanation and discussion of advance directives.  Discussed health care proxy and Living will, and the patient was able to identify a health care proxy as wife.  Patient does not have a living will and power of attorney of health care   Patient Active Problem List   Diagnosis Date Noted   Shingles 02/23/2021   OSA (obstructive sleep apnea) 05/27/2019   Erythrocytosis 12/18/2017   Hyperlipidemia 08/27/2015   Displacement of cervical intervertebral disc without myelopathy 08/26/2015   Calculus of kidney 08/26/2015   Cervical radiculitis 08/26/2015   Hypertrophy of tongue papillae 08/26/2015   Allergic rhinitis, seasonal 10/22/2008    No past surgical history on file.  Family History  Problem Relation Age of Onset   Arthritis Mother    Hypertension Mother    Heart attack Mother        hx of light heart attack   Hyperlipidemia Mother    Cancer Father        lung   Kidney disease Brother        had a transplant (hemodialysis)   Stroke Maternal Grandmother    Heart disease Maternal Grandmother    Kidney disease Maternal Grandmother    Prostate cancer Neg Hx     Social History   Socioeconomic History   Marital status: Married    Spouse name: Verdis Frederickson    Number of children: 3   Years of education: Not on file   Highest education level: Some college, no degree  Occupational History   Occupation: Financial controller   Tobacco Use   Smoking status: Never   Smokeless tobacco: Current    Types: Chew   Tobacco comments:    patient has chewed tobacco for 25 years  Vaping Use   Vaping Use: Never used  Substance and Sexual Activity   Alcohol use: No    Alcohol/week: 0.0 standard drinks of alcohol   Drug use: No   Sexual activity: Yes  Partners: Female  Other Topics Concern   Not on file  Social History Narrative   Not on file   Social Determinants of Health   Financial Resource  Strain: Low Risk  (07/14/2022)   Overall Financial Resource Strain (CARDIA)    Difficulty of Paying Living Expenses: Not hard at all  Food Insecurity: No Food Insecurity (07/14/2022)   Hunger Vital Sign    Worried About Running Out of Food in the Last Year: Never true    Ran Out of Food in the Last Year: Never true  Transportation Needs: No Transportation Needs (07/14/2022)   PRAPARE - Hydrologist (Medical): No    Lack of Transportation (Non-Medical): No  Physical Activity: Insufficiently Active (07/14/2022)   Exercise Vital Sign    Days of Exercise per Week: 2 days    Minutes of Exercise per Session: 20 min  Stress: No Stress Concern Present (07/14/2022)   [place]    Feeling of Stress : Not at all  Social Connections: Moderately Integrated (07/14/2022)   Social Connection and Isolation Panel [NHANES]    Frequency of Communication with Friends and Family: More than three times a week    Frequency of Social Gatherings with Friends and Family: Once a week    Attends Religious Services: More than 4 times per year    Active Member of [employer] or Organizations: No    Attends Archivist Meetings: Never    Marital Status: Married  Human resources officer Violence: Not At Risk (07/14/2022)   Humiliation, Afraid, Rape, and Kick questionnaire    Fear of Current or Ex-Partner: No    Emotionally Abused: No    Physically Abused: No    Sexually Abused: No     Current Outpatient Medications:    ibuprofen (ADVIL) 800 MG tablet, Take 1 tablet (800 mg total) by mouth every 8 (eight) hours as needed for moderate pain., Disp: 90 tablet, Rfl: 0   rosuvastatin (CRESTOR) 20 MG tablet, Take 1 tablet (20 mg total) by mouth daily., Disp: 90 tablet, Rfl: 1   cyclobenzaprine (FLEXERIL) 5 MG tablet, Take 1-2 tablets (5-10 mg total) by mouth at bedtime. (Patient not taking: Reported on 07/14/2022), Disp: 90 tablet, Rfl:  0   methocarbamol (ROBAXIN) 500 MG tablet, Take 1 tablet (500 mg total) by mouth 2 (two) times daily as needed for muscle spasms. (Patient not taking: Reported on 07/14/2022), Disp: 20 tablet, Rfl: 0   naproxen (NAPROSYN) 500 MG tablet, Take 1 tablet (500 mg total) by mouth 2 (two) times daily with a meal. (Patient not taking: Reported on 07/14/2022), Disp: 20 tablet, Rfl: 0  No Known Allergies   ROS  Constitutional: Negative for fever or weight change.  Respiratory: Negative for cough and shortness of breath.   Cardiovascular: Negative for chest pain or palpitations.  Gastrointestinal: Negative for abdominal pain, no bowel changes.  Musculoskeletal: Negative for gait problem or joint swelling.  Skin: Negative for rash.  Neurological: Negative for dizziness or headache.  No other specific complaints in a complete review of systems (except as listed in HPI above).    Objective  Vitals:   07/14/22 1542  BP: 118/78  Pulse: 91  Resp: 16  SpO2: 96%  Weight: 187 lb (84.8 kg)  Height: 5\' 10"  (1.778 m)    Body mass index is 26.83 kg/m.  Physical Exam  Constitutional: Patient appears well-developed and well-nourished. No distress.  HENT: Head: Normocephalic  and atraumatic. Ears: B TMs ok, no erythema or effusion; Nose: Nose normal. Mouth/Throat: Oropharynx is clear and moist. No oropharyngeal exudate.  Eyes: Conjunctivae and EOM are normal. Pupils are equal, round, and reactive to light. No scleral icterus.  Neck: Normal range of motion. Neck supple. No JVD present. No thyromegaly present.  Cardiovascular: Normal rate, regular rhythm and normal heart sounds.  No murmur heard. No BLE edema. Pulmonary/Chest: Effort normal and breath sounds normal. No respiratory distress. Abdominal: Soft. Bowel sounds are normal, no distension. There is no tenderness. no masses MALE GENITALIA: Normal descended testes bilaterally, no masses palpated, no hernias, no lesions, no discharge RECTAL: not done   Musculoskeletal: Normal range of motion, no joint effusions. No gross deformities Neurological: he is alert and oriented to person, place, and time. No cranial nerve deficit. Coordination, balance, strength, speech and gait are normal.  Skin: Skin is warm and dry. No rash noted. No erythema.  Psychiatric: Patient has a normal mood and affect. behavior is normal. Judgment and thought content normal.    Fall Risk:    07/14/2022    3:42 PM 12/05/2021    3:41 PM 10/25/2021   11:14 AM 07/05/2021    2:45 PM 03/25/2021    1:22 PM  Fall Risk   Falls in the past year? 0 0 0 0 0  Number falls in past yr: 0 0 0 0 0  Injury with Fall? 0 0 0 0 0  Risk for fall due to : No Fall Risks  No Fall Risks No Fall Risks   Follow up Falls prevention discussed Falls evaluation completed Falls prevention discussed Falls prevention discussed Falls evaluation completed     Functional Status Survey: Is the patient deaf or have difficulty hearing?: No Does the patient have difficulty seeing, even when wearing glasses/contacts?: No Does the patient have difficulty concentrating, remembering, or making decisions?: No Does the patient have difficulty walking or climbing stairs?: No Does the patient have difficulty dressing or bathing?: No Does the patient have difficulty doing errands alone such as visiting a doctor's office or shopping?: No    Assessment & Plan  1. Well adult exam  - Cologuard - Lipid panel - COMPLETE METABOLIC PANEL WITH GFR - CBC with Differential/Platelet - Hemoglobin A1c - PSA  2. Colon cancer screening  - Cologuard  3. Need for immunization against influenza  - Flu Vaccine QUAD 6+ mos PF IM (Fluarix Quad PF)  4. Pure hypercholesterolemia  - Lipid panel  5. Pre-diabetes  - Hemoglobin A1c  6. Erythrocytosis  - CBC with Differential/Platelet  7. Long-term use of high-risk medication  - COMPLETE METABOLIC PANEL WITH GFR - CBC with Differential/Platelet  8.  Prostate cancer screening  - PSA   9. Paresthesia  - B12   -Prostate cancer screening and PSA options (with potential risks and benefits of testing vs not testing) were discussed along with recent recs/guidelines. -USPSTF grade A and B recommendations reviewed with patient; age-appropriate recommendations, preventive care, screening tests, etc discussed and encouraged; healthy living encouraged; see AVS for patient education given to patient -Discussed importance of 150 minutes of physical activity weekly, eat two servings of fish weekly, eat one serving of tree nuts ( cashews, pistachios, pecans, almonds.Marland Kitchen) every other day, eat 6 servings of fruit/vegetables daily and drink plenty of water and avoid sweet beverages.  -Reviewed Health Maintenance: yes

## 2022-07-13 NOTE — Patient Instructions (Signed)

## 2022-07-14 ENCOUNTER — Encounter: Payer: Self-pay | Admitting: Family Medicine

## 2022-07-14 ENCOUNTER — Ambulatory Visit (INDEPENDENT_AMBULATORY_CARE_PROVIDER_SITE_OTHER): Payer: BC Managed Care – PPO | Admitting: Family Medicine

## 2022-07-14 VITALS — BP 118/78 | HR 91 | Resp 16 | Ht 70.0 in | Wt 187.0 lb

## 2022-07-14 DIAGNOSIS — R202 Paresthesia of skin: Secondary | ICD-10-CM | POA: Diagnosis not present

## 2022-07-14 DIAGNOSIS — Z79899 Other long term (current) drug therapy: Secondary | ICD-10-CM

## 2022-07-14 DIAGNOSIS — Z1211 Encounter for screening for malignant neoplasm of colon: Secondary | ICD-10-CM | POA: Diagnosis not present

## 2022-07-14 DIAGNOSIS — Z23 Encounter for immunization: Secondary | ICD-10-CM

## 2022-07-14 DIAGNOSIS — E7800 Pure hypercholesterolemia, unspecified: Secondary | ICD-10-CM

## 2022-07-14 DIAGNOSIS — Z0000 Encounter for general adult medical examination without abnormal findings: Secondary | ICD-10-CM | POA: Diagnosis not present

## 2022-07-14 DIAGNOSIS — D751 Secondary polycythemia: Secondary | ICD-10-CM | POA: Diagnosis not present

## 2022-07-14 DIAGNOSIS — Z125 Encounter for screening for malignant neoplasm of prostate: Secondary | ICD-10-CM | POA: Diagnosis not present

## 2022-07-14 DIAGNOSIS — R7303 Prediabetes: Secondary | ICD-10-CM

## 2022-07-15 LAB — HEMOGLOBIN A1C
Est. average glucose Bld gHb Est-mCnc: 126 mg/dL
Hgb A1c MFr Bld: 6 % — ABNORMAL HIGH (ref 4.8–5.6)

## 2022-07-15 LAB — CBC WITH DIFFERENTIAL/PLATELET
Basophils Absolute: 0 10*3/uL (ref 0.0–0.2)
Basos: 1 %
EOS (ABSOLUTE): 0.1 10*3/uL (ref 0.0–0.4)
Eos: 1 %
Hematocrit: 56.8 % — ABNORMAL HIGH (ref 37.5–51.0)
Hemoglobin: 19.9 g/dL — ABNORMAL HIGH (ref 13.0–17.7)
Immature Grans (Abs): 0 10*3/uL (ref 0.0–0.1)
Immature Granulocytes: 0 %
Lymphocytes Absolute: 3.2 10*3/uL — ABNORMAL HIGH (ref 0.7–3.1)
Lymphs: 47 %
MCH: 30.3 pg (ref 26.6–33.0)
MCHC: 35 g/dL (ref 31.5–35.7)
MCV: 87 fL (ref 79–97)
Monocytes Absolute: 0.8 10*3/uL (ref 0.1–0.9)
Monocytes: 13 %
Neutrophils Absolute: 2.6 10*3/uL (ref 1.4–7.0)
Neutrophils: 38 %
Platelets: 270 10*3/uL (ref 150–450)
RBC: 6.56 x10E6/uL — ABNORMAL HIGH (ref 4.14–5.80)
RDW: 13.8 % (ref 11.6–15.4)
WBC: 6.7 10*3/uL (ref 3.4–10.8)

## 2022-07-15 LAB — COMPREHENSIVE METABOLIC PANEL
ALT: 37 IU/L (ref 0–44)
AST: 25 IU/L (ref 0–40)
Albumin/Globulin Ratio: 1.6 (ref 1.2–2.2)
Albumin: 4.9 g/dL (ref 4.1–5.1)
Alkaline Phosphatase: 103 IU/L (ref 44–121)
BUN/Creatinine Ratio: 13 (ref 9–20)
BUN: 13 mg/dL (ref 6–24)
Bilirubin Total: 0.5 mg/dL (ref 0.0–1.2)
CO2: 23 mmol/L (ref 20–29)
Calcium: 9.9 mg/dL (ref 8.7–10.2)
Chloride: 99 mmol/L (ref 96–106)
Creatinine, Ser: 1.01 mg/dL (ref 0.76–1.27)
Globulin, Total: 3 g/dL (ref 1.5–4.5)
Glucose: 84 mg/dL (ref 70–99)
Potassium: 4.1 mmol/L (ref 3.5–5.2)
Sodium: 138 mmol/L (ref 134–144)
Total Protein: 7.9 g/dL (ref 6.0–8.5)
eGFR: 92 mL/min/{1.73_m2} (ref >59)

## 2022-07-15 LAB — LIPID PANEL
Chol/HDL Ratio: 4.2 ratio (ref 0.0–5.0)
Cholesterol, Total: 172 mg/dL (ref 100–199)
HDL: 41 mg/dL (ref >39)
LDL Chol Calc (NIH): 100 mg/dL — ABNORMAL HIGH (ref 0–99)
Triglycerides: 179 mg/dL — ABNORMAL HIGH (ref 0–149)
VLDL Cholesterol Cal: 31 mg/dL (ref 5–40)

## 2022-07-15 LAB — PSA: Prostate Specific Ag, Serum: 0.7 ng/mL (ref 0.0–4.0)

## 2022-07-15 LAB — VITAMIN B12: Vitamin B-12: 498 pg/mL (ref 232–1245)

## 2022-07-25 NOTE — Progress Notes (Unsigned)
Name: Nicolas White   MRN: [id number]    DOB: November 01, 1975   Date:07/25/2022       Progress Note  Subjective  Chief Complaint  Arm Pain  HPI  *** Patient Active Problem List   Diagnosis Date Noted   Shingles 02/23/2021   OSA (obstructive sleep apnea) 05/27/2019   Erythrocytosis 12/18/2017   Hyperlipidemia 08/27/2015   Displacement of cervical intervertebral disc without myelopathy 08/26/2015   Calculus of kidney 08/26/2015   Cervical radiculitis 08/26/2015   Hypertrophy of tongue papillae 08/26/2015   Allergic rhinitis, seasonal 10/22/2008    No past surgical history on file.  Family History  Problem Relation Age of Onset   Arthritis Mother    Hypertension Mother    Heart attack Mother        hx of light heart attack   Hyperlipidemia Mother    Cancer Father        lung   Kidney disease Brother        had a transplant (hemodialysis)   Stroke Maternal Grandmother    Heart disease Maternal Grandmother    Kidney disease Maternal Grandmother    Prostate cancer Neg Hx     Social History   Tobacco Use   Smoking status: Never   Smokeless tobacco: Current    Types: Chew   Tobacco comments:    patient has chewed tobacco for 25 years  Substance Use Topics   Alcohol use: No    Alcohol/week: 0.0 standard drinks of alcohol     Current Outpatient Medications:    ibuprofen (ADVIL) 800 MG tablet, Take 1 tablet (800 mg total) by mouth every 8 (eight) hours as needed for moderate pain., Disp: 90 tablet, Rfl: 0   rosuvastatin (CRESTOR) 20 MG tablet, Take 1 tablet (20 mg total) by mouth daily., Disp: 90 tablet, Rfl: 1  No Known Allergies  I personally reviewed active problem list, medication list, allergies, family history, social history, health maintenance with the patient/caregiver today.   ROS  ***  Objective  There were no vitals filed for this visit.  There is no height or weight on file to calculate BMI.  Physical Exam ***  Recent Results (from the  past 2160 hour(s))  B12     Status: None   Collection Time: 07/14/22  4:32 PM  Result Value Ref Range   Vitamin B-12 498 232 - 1,245 pg/mL  CBC with Differential/Platelet     Status: Abnormal   Collection Time: 07/14/22  4:32 PM  Result Value Ref Range   WBC 6.7 3.4 - 10.8 x10E3/uL   RBC 6.56 (H) 4.14 - 5.80 x10E6/uL   Hemoglobin 19.9 (H) 13.0 - 17.7 g/dL   Hematocrit 56.8 (H) 37.5 - 51.0 %   MCV 87 79 - 97 fL   MCH 30.3 26.6 - 33.0 pg   MCHC 35.0 31.5 - 35.7 g/dL   RDW 13.8 11.6 - 15.4 %   Platelets 270 150 - 450 x10E3/uL   Neutrophils 38 Not Estab. %   Lymphs 47 Not Estab. %   Monocytes 13 Not Estab. %   Eos 1 Not Estab. %   Basos 1 Not Estab. %   Neutrophils Absolute 2.6 1.4 - 7.0 x10E3/uL   Lymphocytes Absolute 3.2 (H) 0.7 - 3.1 x10E3/uL   Monocytes Absolute 0.8 0.1 - 0.9 x10E3/uL   EOS (ABSOLUTE) 0.1 0.0 - 0.4 x10E3/uL   Basophils Absolute 0.0 0.0 - 0.2 x10E3/uL   Immature Granulocytes 0 Not Estab. %  Immature Grans (Abs) 0.0 0.0 - 0.1 x10E3/uL  Hemoglobin A1c     Status: Abnormal   Collection Time: 07/14/22  4:32 PM  Result Value Ref Range   Hgb A1c MFr Bld 6.0 (H) 4.8 - 5.6 %    Comment:          Prediabetes: 5.7 - 6.4          Diabetes: >6.4          Glycemic control for adults with diabetes: <7.0    Est. average glucose Bld gHb Est-mCnc 126 mg/dL  Lipid panel     Status: Abnormal   Collection Time: 07/14/22  4:32 PM  Result Value Ref Range   Cholesterol, Total 172 100 - 199 mg/dL   Triglycerides 179 (H) 0 - 149 mg/dL   HDL 41 >39 mg/dL   VLDL Cholesterol Cal 31 5 - 40 mg/dL   LDL Chol Calc (NIH) 100 (H) 0 - 99 mg/dL   Chol/HDL Ratio 4.2 0.0 - 5.0 ratio    Comment:                                   T. Chol/HDL Ratio                                             Men  Women                               1/2 Avg.Risk  3.4    3.3                                   Avg.Risk  5.0    4.4                                2X Avg.Risk  9.6    7.1                                 3X Avg.Risk 23.4   11.0   PSA     Status: None   Collection Time: 07/14/22  4:32 PM  Result Value Ref Range   Prostate Specific Ag, Serum 0.7 0.0 - 4.0 ng/mL    Comment: Roche ECLIA methodology. According to the American Urological Association, Serum PSA should decrease and remain at undetectable levels after radical prostatectomy. The AUA defines biochemical recurrence as an initial PSA value 0.2 ng/mL or greater followed by a subsequent confirmatory PSA value 0.2 ng/mL or greater. Values obtained with different assay methods or kits cannot be used interchangeably. Results cannot be interpreted as absolute evidence of the presence or absence of malignant disease.   Comprehensive metabolic panel     Status: None   Collection Time: 07/14/22  4:32 PM  Result Value Ref Range   Glucose 84 70 - 99 mg/dL   BUN 13 6 - 24 mg/dL   Creatinine, Ser 1.01 0.76 - 1.27 mg/dL   eGFR 92 >59 mL/min/1.73   BUN/Creatinine Ratio 13 9 - 20   Sodium 138 134 - 144 mmol/L  Potassium 4.1 3.5 - 5.2 mmol/L   Chloride 99 96 - 106 mmol/L   CO2 23 20 - 29 mmol/L   Calcium 9.9 8.7 - 10.2 mg/dL   Total Protein 7.9 6.0 - 8.5 g/dL   Albumin 4.9 4.1 - 5.1 g/dL   Globulin, Total 3.0 1.5 - 4.5 g/dL   Albumin/Globulin Ratio 1.6 1.2 - 2.2   Bilirubin Total 0.5 0.0 - 1.2 mg/dL   Alkaline Phosphatase 103 44 - 121 IU/L   AST 25 0 - 40 IU/L   ALT 37 0 - 44 IU/L    PHQ2/9:    07/14/2022    3:42 PM 12/05/2021    3:42 PM 10/25/2021   11:15 AM 07/05/2021    2:45 PM 03/25/2021    1:23 PM  Depression screen PHQ 2/9  Decreased Interest 0 0 0 0 0  Down, Depressed, Hopeless 0 0 0 0 0  PHQ - 2 Score 0 0 0 0 0  Altered sleeping 0  0 0   Tired, decreased energy 0  0 1   Change in appetite 0  0 0   Feeling bad or failure about yourself  0  0 0   Trouble concentrating 0  0 0   Moving slowly or fidgety/restless 0  0 0   Suicidal thoughts 0  0 0   PHQ-9 Score 0  0 1     phq 9 is {gen pos [id number]}   Fall  Risk:    07/14/2022    3:42 PM 12/05/2021    3:41 PM 10/25/2021   11:14 AM 07/05/2021    2:45 PM 03/25/2021    1:22 PM  Fall Risk   Falls in the past year? 0 0 0 0 0  Number falls in past yr: 0 0 0 0 0  Injury with Fall? 0 0 0 0 0  Risk for fall due to : No Fall Risks  No Fall Risks No Fall Risks   Follow up Falls prevention discussed Falls evaluation completed Falls prevention discussed Falls prevention discussed Falls evaluation completed      Functional Status Survey:      Assessment & Plan  *** There are no diagnoses linked to this encounter.

## 2022-07-26 ENCOUNTER — Ambulatory Visit: Payer: BC Managed Care – PPO | Admitting: Physician Assistant

## 2022-07-26 ENCOUNTER — Encounter: Payer: Self-pay | Admitting: Physician Assistant

## 2022-07-26 VITALS — BP 124/74 | HR 76 | Temp 97.6°F | Resp 16 | Ht 70.0 in | Wt 188.0 lb

## 2022-07-26 DIAGNOSIS — M25521 Pain in right elbow: Secondary | ICD-10-CM | POA: Diagnosis not present

## 2022-07-26 NOTE — Progress Notes (Signed)
Acute Office Visit   Patient: Nicolas White   DOB: [birth year]   46 y.o. Male  MRN: [id number] Visit Date: 07/26/2022  Today's healthcare provider: Dani Gobble Jannetta Massey, PA-C  Introduced myself to the patient as a Journalist, newspaper and provided education on APPs in clinical practice.    Chief Complaint  Patient presents with   Joint Pain    Right arm mainly near elbow, denies injuring it. Pt states feels sore but hurts to put weight on it   Subjective    HPI HPI     Joint Pain    Additional comments: Right arm mainly near elbow, denies injuring it. Pt states feels sore but hurts to put weight on it      Last edited by Salomon Fick, [place] on 07/26/2022 10:59 AM.       Right Arm pain  Onset:  sudden  Duration: since 12/10-12/11 Location: right elbow - reports it feels stiff sometimes  Radiation: some ache in right hand  Pain level and character: 3/10 and sore in nature  Alleviating: Tylenol  Aggravating: lifting objects Denies injury or trauma to the area  Interventions: Tylenol      Patient Active Problem List   Diagnosis Date Noted   Shingles 02/23/2021   OSA (obstructive sleep apnea) 05/27/2019   Erythrocytosis 12/18/2017   Hyperlipidemia 08/27/2015   Displacement of cervical intervertebral disc without myelopathy 08/26/2015   Calculus of kidney 08/26/2015   Cervical radiculitis 08/26/2015   Hypertrophy of tongue papillae 08/26/2015   Allergic rhinitis, seasonal 10/22/2008     Medications: Outpatient Medications Prior to Visit  Medication Sig   ibuprofen (ADVIL) 800 MG tablet Take 1 tablet (800 mg total) by mouth every 8 (eight) hours as needed for moderate pain.   rosuvastatin (CRESTOR) 20 MG tablet Take 1 tablet (20 mg total) by mouth daily.   No facility-administered medications prior to visit.    Review of Systems  Musculoskeletal:  Positive for arthralgias and myalgias.  Neurological:  Negative for weakness and numbness.       Objective     BP 124/74   Pulse 76   Temp 97.6 F (36.4 C) (Oral)   Resp 16   Ht 5\' 10"  (1.778 m)   Wt 188 lb (85.3 kg)   SpO2 95%   BMI 26.98 kg/m    Physical Exam Vitals reviewed.  Constitutional:      General: He is awake.     Appearance: Normal appearance. He is well-developed and well-groomed.  HENT:     Head: Normocephalic and atraumatic.  Musculoskeletal:     Right elbow: No swelling, deformity, effusion or lacerations. Normal range of motion. No tenderness.     Left elbow: No swelling, deformity or lacerations. Normal range of motion.     Comments: Mild discomfort with supination of right arm/ hand Strength of right and left arms is 5/5  Elbow ROM is intact but extension is mildly painful per patient No evidence of swelling, effusion or inflamed bursa to palpation   Neurological:     Mental Status: He is alert.  Psychiatric:        Behavior: Behavior is cooperative.       No results found for any visits on 07/26/22.  Assessment & Plan      No follow-ups on file.      Problem List Items Addressed This Visit   None Visit Diagnoses     Right elbow pain    -  Primary Acute, new concern Reports mild elbow and forearm pain that started about a week ago PE was reassuring today and I do not suspect dislocation, osseous injury, bursitis, or effusion at this time  Suspect potential muscle injury, strain at this time Recommend conservative management with OTC Tylenol, Ibuprofen, Lidocaine patches  Can also incorporate warm compresses, gentle stretching and massage to improve symptoms Follow up as needed if symptoms persist or progress over the next 2-4 weeks          No follow-ups on file.   I, Kenn Rekowski E Disney Ruggiero, PA-C, have reviewed all documentation for this visit. The documentation on 07/26/22 for the exam, diagnosis, procedures, and orders are all accurate and complete.   Jacquelin Hawking, MHS, PA-C [facility]

## 2022-07-26 NOTE — Patient Instructions (Addendum)
  Based on your symptoms and physical exam I believe the following is the cause of your concern today A strained muscles in your arm   Rest Warm compresses to the area (20 minutes on, minimum of 30 minutes off) You can alternate Tylenol and Ibuprofen for pain management but Ibuprofen is typically preferred to reduce inflammation.  You can also use Lidocaine patches that can be found over the counter to assist with pain and discomfort  Gentle stretches and exercises that I have included in your paperwork Try to reduce excess strain to the area and rest as much as possible   If these measures do not lead to improvement in your symptoms over the next 2-4 weeks please let us know

## 2022-08-14 ENCOUNTER — Other Ambulatory Visit: Payer: Self-pay | Admitting: Family Medicine

## 2022-08-14 DIAGNOSIS — M545 Low back pain, unspecified: Secondary | ICD-10-CM

## 2022-08-15 MED ORDER — IBUPROFEN 800 MG PO TABS: 800.0000 mg | ORAL_TABLET | Freq: Three times a day (TID) | ORAL | 0 refills | Status: DC | PRN

## 2022-08-24 ENCOUNTER — Other Ambulatory Visit: Payer: Self-pay | Admitting: Family Medicine

## 2022-08-24 DIAGNOSIS — E7800 Pure hypercholesterolemia, unspecified: Secondary | ICD-10-CM

## 2022-08-31 ENCOUNTER — Ambulatory Visit: Payer: Self-pay | Admitting: *Deleted

## 2022-08-31 NOTE — Telephone Encounter (Signed)
  Chief Complaint: itching, swelling- localized area of R shin Symptoms: itching, swelling Frequency: 2 months Pertinent Negatives: Patient denies fever- other symptoms Disposition: [] ED /[] Urgent Care (no appt availability in office) / [x] Appointment(In office/virtual)/ []  [facility]/ [] Home Care/ [] Refused Recommended Disposition /[] [facility]/ []  Follow-up with PCP Additional Notes: Patient is not sure the cause if itching and swelling- but itching is severe and he has scratched enough to cause wound which he reports has healed.

## 2022-08-31 NOTE — Progress Notes (Signed)
   Acute Office Visit  Subjective:     Patient ID: Nicolas White, male    DOB: [birth year], 47 y.o.   MRN: [id number]  Chief Complaint  Patient presents with   Rash    Right shin/reaction/infection    HPI Patient is in today for rash on right shin.   RASH Duration:  days  Location: 2 patches on lower abdomen but worse around scar on right shin Itching: yes Burning: no Redness: yes Oozing: no Scaling: no Blisters: no Painful: no Fevers: no Change in detergents/soaps/personal care products:  uncertain if wife changes anything Recent illness: no Recent travel:no History of same: no Context: worse Alleviating factors: nothing Treatments attempted:nothing Shortness of breath: no  Throat/tongue swelling: no Myalgias/arthralgias: no   Review of Systems  Constitutional:  Negative for chills and fever.  Skin:  Positive for itching and rash.        Objective:    BP 116/80   Pulse 74   Temp 97.8 F (36.6 C)   Resp 18   Ht 5\' 10"  (1.778 m)   Wt 185 lb 1.6 oz (84 kg)   SpO2 96%   BMI 26.56 kg/m  BP Readings from Last 3 Encounters:  09/01/22 116/80  07/26/22 124/74  07/14/22 118/78   Wt Readings from Last 3 Encounters:  09/01/22 185 lb 1.6 oz (84 kg)  07/26/22 188 lb (85.3 kg)  07/14/22 187 lb (84.8 kg)      Physical Exam Constitutional:      Appearance: Normal appearance.  HENT:     Head: Normocephalic and atraumatic.  Eyes:     Conjunctiva/sclera: Conjunctivae normal.  Cardiovascular:     Rate and Rhythm: Normal rate and regular rhythm.  Pulmonary:     Effort: Pulmonary effort is normal.     Breath sounds: Normal breath sounds.  Skin:    General: Skin is warm and dry.     Findings: Rash present.     Comments: Erythematous macular rash with small grouping of excoriations on lower abdomen and adhesive-shaped erythematous patch around scar on right shin.  Neurological:     General: No focal deficit present.     Mental Status: He is alert.  Mental status is at baseline.  Psychiatric:        Mood and Affect: Mood normal.        Behavior: Behavior normal.     No results found for any visits on 09/01/22.      Assessment & Plan:   1. Allergic contact dermatitis due to adhesives: Treat with steroid cream, over the counter Benadryl cream and oral anti-histamines. No infection but skin of scar is thin, discussed avoiding scratching to prevent bacterial infection. Using Neosporin ointment, will continued.   - hydrocortisone 1 % ointment; Apply 1 Application topically 2 (two) times daily.  Dispense: 30 g; Refill: 0 - cetirizine (ZYRTEC) 10 MG tablet; Take 1 tablet (10 mg total) by mouth daily.  Dispense: 30 tablet; Refill: 11   Return if symptoms worsen or fail to improve.  Teodora Medici, DO

## 2022-08-31 NOTE — Telephone Encounter (Signed)
Reason for Disposition  Looks like a boil, infected sore, deep ulcer or other infected rash (spreading redness, pus)  Answer Assessment - Initial Assessment Questions 1. ONSET: "When did the swelling start?" (e.g., minutes, hours, days)     Itching swelling- 2 months 2. LOCATION: "What part of the leg is swollen?"  "Are both legs swollen or just one leg?"     Shin of R leg 3. SEVERITY: "How bad is the swelling?" (e.g., localized; mild, moderate, severe)   - Localized: Small area of swelling localized to one leg.   - MILD pedal edema: Swelling limited to foot and ankle, pitting edema < 1/4 inch (6 mm) deep, rest and elevation eliminate most or all swelling.   - MODERATE edema: Swelling of lower leg to knee, pitting edema > 1/4 inch (6 mm) deep, rest and elevation only partially reduce swelling.   - SEVERE edema: Swelling extends above knee, facial or hand swelling present.      Localized- small area 4. REDNESS: "Does the swelling look red or infected?"     yes 5. PAIN: "Is the swelling painful to touch?" If Yes, ask: "How painful is it?"   (Scale 1-10; mild, moderate or severe)     no 6. FEVER: "Do you have a fever?" If Yes, ask: "What is it, how was it measured, and when did it start?"      no 7. CAUSE: "What do you think is causing the leg swelling?"     Infection- red, swollen 8. MEDICAL HISTORY: "Do you have a history of blood clots (e.g., DVT), cancer, heart failure, kidney disease, or liver failure?"     no 9. RECURRENT SYMPTOM: "Have you had leg swelling before?" If Yes, ask: "When was the last time?" "What happened that time?"     Previous injury of same area 10. OTHER SYMPTOMS: "Do you have any other symptoms?" (e.g., chest pain, difficulty breathing)       no  Protocols used: Leg Swelling and Edema-A-AH

## 2022-09-01 ENCOUNTER — Encounter: Payer: Self-pay | Admitting: Internal Medicine

## 2022-09-01 ENCOUNTER — Ambulatory Visit: Payer: BC Managed Care – PPO | Admitting: Internal Medicine

## 2022-09-01 VITALS — BP 116/80 | HR 74 | Temp 97.8°F | Resp 18 | Ht 70.0 in | Wt 185.1 lb

## 2022-09-01 DIAGNOSIS — L231 Allergic contact dermatitis due to adhesives: Secondary | ICD-10-CM | POA: Diagnosis not present

## 2022-09-01 MED ORDER — HYDROCORTISONE 1 % EX OINT: 1.0000 | TOPICAL_OINTMENT | Freq: Two times a day (BID) | CUTANEOUS | 0 refills | Status: DC

## 2022-09-01 MED ORDER — CETIRIZINE HCL 10 MG PO TABS: 10.0000 mg | ORAL_TABLET | Freq: Every day | ORAL | 11 refills | Status: DC

## 2022-09-01 NOTE — Patient Instructions (Addendum)
It was great seeing you today!  Plan discussed at today's visit: -Use steroid cream and over the counter Bendaryl cream for itching  -Also use a fragrance free moisturizer for sensitive skin, use after the shower and before bed to prevent dry skin -Take Zyrtec as needed for itching - if it is causing you to be sleepy switch to over the counter Allegra or Claritin   Follow up in: as needed   Take care and let us know if you have any questions or concerns prior to your next visit.  Dr. Rosana Berger

## 2022-10-25 NOTE — Progress Notes (Deleted)
Name: Nicolas White   MRN: [id number]    DOB: 1976-06-16   Date:10/25/2022       Progress Note  Subjective  Chief Complaint  Follow Up  HPI  Anger problems: doing much better, sleeping better now, still has hydroxyzine at home but states not taking it lately.    Intermittent low back pain: he still has medications at home  He states has intermittent stiffness,, usually worse at the end of the day when sitting down for a while. No longer having radiculitis( used to radiate to abdomen or left groin) . He is out of flexeril and we will send a refill.    Hyperlipidemia: taking crestor, denies  myalgia, continue medications LDL was at goal for him at 92 , we will recheck it yearly  History of kidney stone: last CT was negative, under the care of Urologist, denies hematuria , dysuria or flank pain . He states when he notices dysuria he drinks cranberry juice and increases water intake and it resolves, no episodes lately   OSA: also has increase HCT/Hbg, he still has not started wearing his CPAP machine.   Pre-diabetes: denies polyphagia, polydipsia or polyuria, last A1C 6 % . We will continue to monitor   Patient Active Problem List   Diagnosis Date Noted   Shingles 02/23/2021   OSA (obstructive sleep apnea) 05/27/2019   Erythrocytosis 12/18/2017   Hyperlipidemia 08/27/2015   Displacement of cervical intervertebral disc without myelopathy 08/26/2015   Calculus of kidney 08/26/2015   Cervical radiculitis 08/26/2015   Hypertrophy of tongue papillae 08/26/2015   Allergic rhinitis, seasonal 10/22/2008    No past surgical history on file.  Family History  Problem Relation Age of Onset   Arthritis Mother    Hypertension Mother    Heart attack Mother        hx of light heart attack   Hyperlipidemia Mother    Cancer Father        lung   Kidney disease Brother        had a transplant (hemodialysis)   Stroke Maternal Grandmother    Heart disease Maternal Grandmother    Kidney  disease Maternal Grandmother    Prostate cancer Neg Hx     Social History   Tobacco Use   Smoking status: Never   Smokeless tobacco: Current    Types: Chew   Tobacco comments:    patient has chewed tobacco for 25 years  Substance Use Topics   Alcohol use: No    Alcohol/week: 0.0 standard drinks of alcohol     Current Outpatient Medications:    cetirizine (ZYRTEC) 10 MG tablet, Take 1 tablet (10 mg total) by mouth daily., Disp: 30 tablet, Rfl: 11   hydrocortisone 1 % ointment, Apply 1 Application topically 2 (two) times daily., Disp: 30 g, Rfl: 0   ibuprofen (ADVIL) 800 MG tablet, Take 1 tablet (800 mg total) by mouth every 8 (eight) hours as needed for moderate pain., Disp: 90 tablet, Rfl: 0   rosuvastatin (CRESTOR) 20 MG tablet, Take 1 tablet by mouth once daily, Disp: 90 tablet, Rfl: 0  No Known Allergies  I personally reviewed active problem list, medication list, allergies, family history, social history, health maintenance with the patient/caregiver today.   ROS  ***  Objective  There were no vitals filed for this visit.  There is no height or weight on file to calculate BMI.  Physical Exam ***  No results found for this or any previous visit (  from the past 2160 hour(s)).   PHQ2/9:    09/01/2022    8:17 AM 07/26/2022   10:52 AM 07/14/2022    3:42 PM 12/05/2021    3:42 PM 10/25/2021   11:15 AM  Depression screen PHQ 2/9  Decreased Interest 0 0 0 0 0  Down, Depressed, Hopeless 0 0 0 0 0  PHQ - 2 Score 0 0 0 0 0  Altered sleeping 0 0 0  0  Tired, decreased energy 0 0 0  0  Change in appetite 0 0 0  0  Feeling bad or failure about yourself  0 0 0  0  Trouble concentrating 0 0 0  0  Moving slowly or fidgety/restless 0 0 0  0  Suicidal thoughts 0 0 0  0  PHQ-9 Score 0 0 0  0  Difficult doing work/chores Not difficult at all Not difficult at all       phq 9 is {gen pos [id number]   Fall Risk:    09/01/2022    8:17 AM 07/26/2022   10:52 AM 07/14/2022     3:42 PM 12/05/2021    3:41 PM 10/25/2021   11:14 AM  Fall Risk   Falls in the past year? 0 0 0 0 0  Number falls in past yr: 0 0 0 0 0  Injury with Fall? 0 0 0 0 0  Risk for fall due to :  No Fall Risks No Fall Risks  No Fall Risks  Follow up  Falls prevention discussed;Education provided;Falls evaluation completed Falls prevention discussed Falls evaluation completed Falls prevention discussed      Functional Status Survey:      Assessment & Plan  *** There are no diagnoses linked to this encounter.

## 2022-10-26 ENCOUNTER — Ambulatory Visit: Payer: BC Managed Care – PPO | Admitting: Family Medicine

## 2022-11-21 ENCOUNTER — Other Ambulatory Visit: Payer: Self-pay | Admitting: Family Medicine

## 2022-11-21 DIAGNOSIS — E7800 Pure hypercholesterolemia, unspecified: Secondary | ICD-10-CM

## 2023-01-04 ENCOUNTER — Ambulatory Visit: Payer: BC Managed Care – PPO | Admitting: Internal Medicine

## 2023-01-04 ENCOUNTER — Ambulatory Visit: Payer: Self-pay | Admitting: *Deleted

## 2023-01-04 VITALS — BP 116/76 | HR 88 | Temp 97.9°F | Resp 18 | Ht 70.0 in | Wt 179.9 lb

## 2023-01-04 DIAGNOSIS — J0110 Acute frontal sinusitis, unspecified: Secondary | ICD-10-CM

## 2023-01-04 DIAGNOSIS — H5789 Other specified disorders of eye and adnexa: Secondary | ICD-10-CM

## 2023-01-04 DIAGNOSIS — J029 Acute pharyngitis, unspecified: Secondary | ICD-10-CM | POA: Diagnosis not present

## 2023-01-04 DIAGNOSIS — R6889 Other general symptoms and signs: Secondary | ICD-10-CM

## 2023-01-04 LAB — POCT RAPID STREP A (OFFICE): Rapid Strep A Screen: NEGATIVE

## 2023-01-04 MED ORDER — AMOXICILLIN-POT CLAVULANATE 875-125 MG PO TABS: 1.0000 | ORAL_TABLET | Freq: Two times a day (BID) | ORAL | 0 refills | Status: AC

## 2023-01-04 NOTE — Progress Notes (Signed)
Acute Office Visit  Subjective:     Patient ID: Nicolas White, male    DOB: 1976/07/20, 47 y.o.   MRN: [id number]  Chief Complaint  Patient presents with   Eye Pain    HPI Patient is in today for eye pain.  EYE PAIN Duration:   4 days Involved eye:  left - more on forehead and around eye Onset: gradual Quality: aching Foreign body sensation:no Visual impairment: no Eye redness: yes Discharge: yes Crusting or matting of eyelids: no Swelling: no Photophobia: yes Itching: no Tearing: yes Headache: yes Floaters: no URI symptoms: no Contact lens use: no Close contacts with similar problems: no Eye trauma: no Aggravating factors:  Alleviating factors:  Status: fluctuating Treatments attempted: nothing    Review of Systems  Constitutional:  Positive for chills. Negative for fever.  HENT:  Positive for sinus pain. Negative for congestion and sore throat.   Eyes:  Positive for photophobia, pain, discharge and redness. Negative for blurred vision and double vision.        Objective:    BP 116/76   Pulse 88   Temp 97.9 F (36.6 C)   Resp 18   Ht 5\' 10"  (1.778 m)   Wt 179 lb 14.4 oz (81.6 kg)   SpO2 98%   BMI 25.81 kg/m  BP Readings from Last 3 Encounters:  01/04/23 116/76  09/01/22 116/80  07/26/22 124/74   Wt Readings from Last 3 Encounters:  01/04/23 179 lb 14.4 oz (81.6 kg)  09/01/22 185 lb 1.6 oz (84 kg)  07/26/22 188 lb (85.3 kg)      Physical Exam Constitutional:      Appearance: Normal appearance.  HENT:     Head: Normocephalic and atraumatic.     Comments: Pain over left frontal sinus    Nose: Rhinorrhea present.     Mouth/Throat:     Mouth: Mucous membranes are moist.     Pharynx: Posterior oropharyngeal erythema present.  Eyes:     General: Lids are normal.        Right eye: No foreign body, discharge or hordeolum.        Left eye: No foreign body, discharge or hordeolum.     Extraocular Movements: Extraocular movements intact.      Right eye: Normal extraocular motion.     Left eye: Normal extraocular motion.     Pupils: Pupils are equal, round, and reactive to light.     Comments: Mild bilateral occular injection  Cardiovascular:     Rate and Rhythm: Normal rate and regular rhythm.  Pulmonary:     Effort: Pulmonary effort is normal.     Breath sounds: Normal breath sounds.  Skin:    General: Skin is warm and dry.  Neurological:     Mental Status: He is alert.     No results found for any visits on 01/04/23.      Assessment & Plan:   1. Acute non-recurrent frontal sinusitis/Redness of both eyes: Thinking left eye pain is due to frontal sinus pain. No pain with EOM, no vision changes. Bilateral eyes with mild injection, mild watery tearing. Recommend restarting Zyrtec and refresh drops. Will place a referral to Ophthalmology for complete eye exam. Treat sinusitis with Augmentin.   - amoxicillin-clavulanate (AUGMENTIN) 875-125 MG tablet; Take 1 tablet by mouth 2 (two) times daily for 5 days.  Dispense: 10 tablet; Refill: 0  2. Abnormal ear, nose, and throat evaluation: No throat pain but red on exam,  rapid strep here negative.   - Rapid Strep screen  Return if symptoms worsen or fail to improve.  Margarita Mail, DO

## 2023-01-04 NOTE — Telephone Encounter (Signed)
Attempted to return his call.  Left a voicemail to call back. 

## 2023-01-04 NOTE — Telephone Encounter (Signed)
Message from Allen Kell sent at 01/04/2023  8:34 AM EDT  Summary: left eye aching   Pt states that his left eye is aching and running water, and his forehead is aching. Pt is wanting to see if his PCP will call in some medication for him.   [facility] [place], [place] - [address] Phone: [phone] Fax: [phone]          Call History   Type Contact Phone/Fax User  01/04/2023 08:33 AM EDT Phone (Incoming) [name] (Self) [phone] Judie Petit) Johney Frame, Dondra Prader

## 2023-01-04 NOTE — Addendum Note (Signed)
Addended by: Davene Costain on: 01/04/2023 03:50 PM   Modules accepted: Orders

## 2023-01-04 NOTE — Telephone Encounter (Signed)
      Chief Complaint: Left eye pain, watery Symptoms: Above Frequency: Monday Pertinent Negatives: Patient denies vision changes Disposition: [] ED /[] Urgent Care (no appt availability in office) / [x] Appointment(In office/virtual)/ []  [place] [facility]/ [] Home Care/ [] Refused Recommended Disposition /[] [place] [facility]/ []  Follow-up with PCP Additional Notes:   Reason for Disposition  MODERATE eye pain or discomfort (e.g., interferes with normal activities or awakens from sleep; more than mild)  Answer Assessment - Initial Assessment Questions 1. ONSET: "When did the pain start?" (e.g., minutes, hours, days)     Monday 2. TIMING: "Does the pain come and go, or has it been constant since it started?" (e.g., constant, intermittent, fleeting)     Comes and goes 3. SEVERITY: "How bad is the pain?"   (Scale 1-10; mild, moderate or severe)   - MILD (1-3): doesn't interfere with normal activities    - MODERATE (4-7): interferes with normal activities or awakens from sleep    - SEVERE (8-10): excruciating pain and patient unable to do normal activities     5 4. LOCATION: "Where does it hurt?"  (e.g., eyelid, eye, cheekbone)     Left eye 5. CAUSE: "What do you think is causing the pain?"     Unsure 6. VISION: "Do you have blurred vision or changes in your vision?"      No 7. EYE DISCHARGE: "Is there any discharge (pus) from the eye(s)?"  If Yes, ask: "What color is it?"      Running, watery 8. FEVER: "Do you have a fever?" If Yes, ask: "What is it, how was it measured, and when did it start?"      No 9. OTHER SYMPTOMS: "Do you have any other symptoms?" (e.g., headache, nasal discharge, facial rash)     Headache 10. PREGNANCY: "Is there any chance you are pregnant?" "When was your last menstrual period?"       N/a  Protocols used: Eye Pain and Other Symptoms-A-AH

## 2023-01-12 ENCOUNTER — Ambulatory Visit: Payer: Self-pay | Admitting: *Deleted

## 2023-01-12 NOTE — Telephone Encounter (Signed)
Called patient, he states no vision changes or pain with movement.  Told him to move up eye appt and if anything changes go to ER.

## 2023-01-12 NOTE — Telephone Encounter (Signed)
  Chief Complaint: Seen 01/04/2023 by Dr Caralee Ates for sinus infection.   Not improved after antibiotic and allergy medicine Symptoms: Feels stopped up on left side of forehead and pain over his left eye.   Left eye is watery.   Not blowing anything out of his nose but feels stopped up on left side. Frequency: Since before he was seen on 5/30. Pertinent Negatives: Patient denies being able to blow anything out of his nose, no fever, coughing, or sore throat.    Disposition: [] ED /[] Urgent Care (no appt availability in office) / [] Appointment(In office/virtual)/ []  [facility]/ [] Home Care/ [] Refused Recommended Disposition /[] [place] [facility]/ [x]  Follow-up with PCP Additional Notes: Message sent to Dr. Caralee Ates.   Pt agreeable to someone calling him back.   He does have an eye dr appt. On 6/24 as Dr. Caralee Ates recommended.

## 2023-01-12 NOTE — Telephone Encounter (Signed)
Message from Randol Kern sent at 01/12/2023  9:56 AM EDT  Summary: Reaction to antibiotic, symptoms not improving   Pt says that he has one more pill of his antibiotic cycle, and his symptoms have not improved. He says his head aches and his eyes water. This reaction keeps returning daily.  Best contact: [phone]          Call History   Type Contact Phone/Fax User  01/12/2023 09:54 AM EDT Phone (Incoming) [name] (Self) [phone] Judie Petit) Randol Kern   Reason for Disposition  [1] Sinus pain of forehead AND [2] yellow or green nasal discharge    No nasal discharge.  Left side is stopped up.   Pain around left eye and forehead.   Seen 01/04/2023  Answer Assessment - Initial Assessment Questions 1. LOCATION: "Where does it hurt?"      Over left eye and forehead is hurting.   It feels stopped up.   Not blowing anything out of nose.   No coughing or fever.  My left eye is still watering. 2. ONSET: "When did the headache start?" (Minutes, hours or days)      I saw the dr last week and she gave antibiotic and allergy medicine but I'm not better.    3. PATTERN: "Does the pain come and go, or has it been constant since it started?"     My left eye hurts over the top of my eye and it waters.   Sensitive to light.   My forehead hurts over my eye.  Ibuprofen helped the pain.   It comes back every day.    I have an eye appt on 24th.    4. SEVERITY: "How bad is the pain?" and "What does it keep you from doing?"  (e.g., Scale 1-10; mild, moderate, or severe)   - MILD (1-3): doesn't interfere with normal activities    - MODERATE (4-7): interferes with normal activities or awakens from sleep    - SEVERE (8-10): excruciating pain, unable to do any normal activities        Moderate 5. RECURRENT SYMPTOM: "Have you ever had headaches before?" If Yes, ask: "When was the last time?" and "What happened that time?"      Not asked 6. CAUSE: "What do you think is causing the  headache?"     She told me I had a sinus infection and gave me an antibiotic and allergy medicine.   I also got an appt with an eye dr that she recommended. 7. MIGRAINE: "Have you been diagnosed with migraine headaches?" If Yes, ask: "Is this headache similar?"      Not asked 8. HEAD INJURY: "Has there been any recent injury to the head?"      Not asked 9. OTHER SYMPTOMS: "Do you have any other symptoms?" (fever, stiff neck, eye pain, sore throat, cold symptoms)     Left eye just watering.   No redness, pain or burning.   No itching. 10. PREGNANCY: "Is there any chance you are pregnant?" "When was your last menstrual period?"       N/A  Protocols used: Headache-A-AH

## 2023-01-18 DIAGNOSIS — J018 Other acute sinusitis: Secondary | ICD-10-CM | POA: Diagnosis not present

## 2023-01-18 DIAGNOSIS — J3489 Other specified disorders of nose and nasal sinuses: Secondary | ICD-10-CM | POA: Diagnosis not present

## 2023-02-21 ENCOUNTER — Other Ambulatory Visit: Payer: Self-pay | Admitting: Family Medicine

## 2023-02-21 DIAGNOSIS — E7800 Pure hypercholesterolemia, unspecified: Secondary | ICD-10-CM

## 2023-03-21 ENCOUNTER — Encounter: Payer: Self-pay | Admitting: Family Medicine

## 2023-05-24 ENCOUNTER — Ambulatory Visit: Payer: BC Managed Care – PPO | Admitting: Physician Assistant

## 2023-05-24 ENCOUNTER — Ambulatory Visit: Payer: Self-pay

## 2023-05-24 ENCOUNTER — Encounter: Payer: Self-pay | Admitting: Physician Assistant

## 2023-05-24 VITALS — BP 128/76 | HR 87 | Temp 98.0°F | Resp 16 | Ht 70.0 in | Wt 183.3 lb

## 2023-05-24 DIAGNOSIS — J3089 Other allergic rhinitis: Secondary | ICD-10-CM | POA: Diagnosis not present

## 2023-05-24 NOTE — Patient Instructions (Addendum)
I also recommend adding an antihistamine to your daily regimen This includes medications like Claritin, Allegra, Zyrtec- the generics of these work very well and are usually less expensive I recommend using Flonase nasal spray - 2 puffs twice per day to help with your nasal congestion The antihistamines and Flonase can take a few weeks to provide significant relief from allergy symptoms but should start to provide some benefit soon.  I also recommend starting an antihistamine eye drop like Ketotifen to help with your eye irritation. You can use lubricating eye drops as well - things like Blink tears or Rephresh tears to help with the grittiness or irritation

## 2023-05-24 NOTE — Telephone Encounter (Signed)
  Chief Complaint: Pain behind left eye, sinus congestion left side, runny eye left eye Symptoms: above Frequency: a week Pertinent Negatives: Patient denies fever Disposition: [] ED /[] Urgent Care (no appt availability in office) / [x] Appointment(In office/virtual)/ []  [place] [facility]/ [] Home Care/ [] Refused Recommended Disposition /[] [place] [facility]/ []  Follow-up with PCP Additional Notes: Pt states that this is similar to issue in May. Pt has pain behind his left eye, left eye is runny and left side of nose is stuffed up.  Summary: sinus issues/runny eye   Patient called stating he is experiencing sinus issues, he states he feels sinus pressure behind his left eye and his left eye is runny. No fever. Patient declined an appointment today with Erin Mecum.Patient states he just needs some medication called in for this sinus issue.  Patients callback # [phone]     Reason for Disposition  [1] Sinus congestion (pressure, fullness) AND [2] present > 10 days  Answer Assessment - Initial Assessment Questions 1. LOCATION: "Where does it hurt?"      Left eye runny and left side stuffy 2. ONSET: "When did the sinus pain start?"  (e.g., hours, days)      1 week 3. SEVERITY: "How bad is the pain?"   (Scale 1-10; mild, moderate or severe)   - MILD (1-3): doesn't interfere with normal activities    - MODERATE (4-7): interferes with normal activities (e.g., work or school) or awakens from sleep   - SEVERE (8-10): excruciating pain and patient unable to do any normal activities        4/10 4. RECURRENT SYMPTOM: "Have you ever had sinus problems before?" If Yes, ask: "When was the last time?" and "What happened that time?"      Yes - may 5. NASAL CONGESTION: "Is the nose blocked?" If Yes, ask: "Can you open it or must you breathe through your mouth?"     Congestion 6. NASAL DISCHARGE: "Do you have discharge from your nose?" If so ask, "What color?"     yes 7. FEVER: "Do you  have a fever?" If Yes, ask: "What is it, how was it measured, and when did it start?"      no 8. OTHER SYMPTOMS: "Do you have any other symptoms?" (e.g., sore throat, cough, earache, difficulty breathing)     Feels fine  Protocols used: Sinus Pain or Congestion-A-AH

## 2023-05-24 NOTE — Progress Notes (Signed)
Acute Office Visit   Patient: Nicolas White   DOB: 06/13/1976   47 y.o. Male  MRN: [id number] Visit Date: 05/24/2023  Today's healthcare provider: Oswaldo Conroy Wilmot Quevedo, PA-C  Introduced myself to the patient as a Secondary school teacher and provided education on APPs in clinical practice.    Chief Complaint  Patient presents with   Facial Pain    Around eyes onset for a week   Nasal Congestion   Subjective    HPI HPI     Facial Pain    Additional comments: Around eyes onset for a week      Last edited by Forde Radon, CMA on 05/24/2023  2:00 PM.      URI symptoms  Onset: sudden  Duration: ongoing for about a week  Associated symptoms: eye pain and pressure along the eyebrows, sinus pain and pressure   Interventions: motrin   Recent sick contacts: none to his knowledge  Recent travel: none  COVID testing at home: none     Medications: Outpatient Medications Prior to Visit  Medication Sig   ibuprofen (ADVIL) 800 MG tablet Take 1 tablet (800 mg total) by mouth every 8 (eight) hours as needed for moderate pain.   rosuvastatin (CRESTOR) 20 MG tablet Take 1 tablet by mouth once daily   [DISCONTINUED] cetirizine (ZYRTEC) 10 MG tablet Take 1 tablet (10 mg total) by mouth daily.   [DISCONTINUED] hydrocortisone 1 % ointment Apply 1 Application topically 2 (two) times daily.   No facility-administered medications prior to visit.    Review of Systems  Constitutional:  Negative for chills, fatigue and fever.  HENT:  Positive for congestion, rhinorrhea, sinus pressure and sinus pain. Negative for ear pain, postnasal drip and sore throat.   Eyes:  Positive for pain and discharge.  Respiratory:  Negative for cough, shortness of breath and wheezing.   Musculoskeletal:  Negative for myalgias.  Neurological:  Positive for headaches.        Objective    BP 128/76   Pulse 87   Temp 98 F (36.7 C) (Oral)   Resp 16   Ht 5\' 10"  (1.778 m)   Wt 183 lb 4.8 oz (83.1 kg)    SpO2 98%   BMI 26.30 kg/m     Physical Exam Vitals reviewed.  Constitutional:      General: He is awake.     Appearance: Normal appearance. He is well-developed and well-groomed.  HENT:     Head: Normocephalic and atraumatic.     Right Ear: Hearing, tympanic membrane and ear canal normal.     Left Ear: Hearing, tympanic membrane and ear canal normal.     Mouth/Throat:     Lips: Pink.     Pharynx: Uvula midline.  Eyes:     General: Gaze aligned appropriately. Allergic shiner present.        Right eye: Discharge present.        Left eye: Discharge present.    Extraocular Movements: Extraocular movements intact.     Right eye: Normal extraocular motion and no nystagmus.     Left eye: Normal extraocular motion and no nystagmus.     Conjunctiva/sclera: Conjunctivae normal.     Pupils: Pupils are equal, round, and reactive to light.     Comments: Mildly red conjunctiva bilaterally   Cardiovascular:     Rate and Rhythm: Normal rate and regular rhythm.     Pulses: Normal pulses.  Radial pulses are 2+ on the right side and 2+ on the left side.     Heart sounds: Normal heart sounds. No murmur heard.    No friction rub. No gallop.  Pulmonary:     Effort: Pulmonary effort is normal.     Breath sounds: Normal breath sounds. No decreased air movement. No decreased breath sounds, wheezing, rhonchi or rales.  Neurological:     Mental Status: He is alert.  Psychiatric:        Behavior: Behavior is cooperative.       No results found for any visits on 05/24/23.  Assessment & Plan      No follow-ups on file.      Problem List Items Addressed This Visit       Respiratory   Allergic rhinitis, seasonal - Primary    Chronic, recurrent, current flare He reports sinus pressure, sneezing, eye redness and headache for the past week Suspect this is likely secondary to allergic rhinitis vs infectious etiology given exam and HPI Recommend starting 2nd generation antihistamine,  flonase and ketotifen eye drops along with lubricating eye drops to assist with symptoms Reviewed return precautions Follow up as needed for persistent or progressing symptoms          No follow-ups on file.   I, Tashan Kreitzer E Roshawnda Pecora, PA-C, have reviewed all documentation for this visit. The documentation on 05/24/23 for the exam, diagnosis, procedures, and orders are all accurate and complete.   Jacquelin Hawking, MHS, PA-C [facility]

## 2023-05-24 NOTE — Assessment & Plan Note (Signed)
Chronic, recurrent, current flare He reports sinus pressure, sneezing, eye redness and headache for the past week Suspect this is likely secondary to allergic rhinitis vs infectious etiology given exam and HPI Recommend starting 2nd generation antihistamine, flonase and ketotifen eye drops along with lubricating eye drops to assist with symptoms Reviewed return precautions Follow up as needed for persistent or progressing symptoms

## 2023-07-11 ENCOUNTER — Ambulatory Visit: Payer: Self-pay | Admitting: *Deleted

## 2023-07-11 NOTE — Telephone Encounter (Signed)
Message from [place] T sent at 07/11/2023  2:12 PM EST  Summary: medication request   Patient called stated for he last week his left eye has been running and aching. He has pressure and sensitive to light. He is requesting provider prescribe something for his symptoms. Please f/u with patient          Call History  Contact Date/Time Type Contact Phone/Fax User  07/11/2023 02:10 PM EST Phone (Incoming) [name] (Self) [phone] Judie Petit) Elon Jester   Reason for Disposition  MODERATE eye pain (e.g., interferes with normal activities)  Answer Assessment - Initial Assessment Questions 1. EYE DISCHARGE: "Is the discharge in one or both eyes?" "What color is it?" "How much is there?" "When did the discharge start?"      Running water, and it's aching and sensitive to light.    2. REDNESS OF SCLERA: "Is the redness in one or both eyes?" "When did the redness start?"      I took some aspirin and it feels better.   I don't know if it's sinus or what going on.   I do have sinus problems but no runny nose at this time.   3. EYELIDS: "Are the eyelids red or swollen?" If Yes, ask: "How much?"      No 4. VISION: "Is there any difficulty seeing clearly?"      I can see fine 5. PAIN: "Is there any pain? If Yes, ask: "How bad is it?" (Scale 1-10; or mild, moderate, severe)    - MILD (1-3): doesn't interfere with normal activities     - MODERATE (4-7): interferes with normal activities or awakens from sleep    - SEVERE (8-10): excruciating pain, unable to do any normal activities       My left eye is aching. 6. CONTACT LENS: "Do you wear contacts?"     Not asked 7. OTHER SYMPTOMS: "Do you have any other symptoms?" (e.g., fever, runny nose, cough)     No runny nose, coughing or nasal congestion  I've had this problem before. 8. PREGNANCY: "Is there any chance you are pregnant?" "When was your last menstrual period?"     N/A  Protocols used: Eye - Pus or Discharge-A-AH

## 2023-07-11 NOTE — Telephone Encounter (Signed)
  Chief Complaint: Left eye is watery, sensitive to light and aching.  "Maybe from my sinuses"   I've had this before. Symptoms: Pressure and sensitive to light, watery. Frequency: Last few days Pertinent Negatives: Patient denies pus drainage or runny nose but this happens when his sinuses are congested. Disposition: [] ED /[] Urgent Care (no appt availability in office) / [x] Appointment(In office/virtual)/ []  [facility]/ [] Home Care/ [] Refused Recommended Disposition /[] [facility]/ []  Follow-up with PCP Additional Notes: Appt made with Erin Mecum, PA-C for 07/12/2023 at 2:00.

## 2023-07-12 ENCOUNTER — Ambulatory Visit: Payer: BC Managed Care – PPO | Admitting: Physician Assistant

## 2023-07-12 ENCOUNTER — Encounter: Payer: Self-pay | Admitting: Physician Assistant

## 2023-07-12 VITALS — BP 118/70 | HR 85 | Resp 16 | Ht 70.0 in | Wt 186.0 lb

## 2023-07-12 DIAGNOSIS — G44019 Episodic cluster headache, not intractable: Secondary | ICD-10-CM | POA: Diagnosis not present

## 2023-07-12 MED ORDER — SUMATRIPTAN SUCCINATE 50 MG PO TABS: 50.0000 mg | ORAL_TABLET | ORAL | 0 refills | Status: AC | PRN

## 2023-07-12 NOTE — Progress Notes (Signed)
Acute Office Visit   Patient: Nicolas White   DOB: 10/17/1975   47 y.o. Male  MRN: [id number] Visit Date: 07/12/2023  Today's healthcare provider: Oswaldo Conroy Jesi Jurgens, PA-C  Introduced myself to the patient as a Secondary school teacher and provided education on APPs in clinical practice.    Chief Complaint  Patient presents with   Eye Pain    x2 weeks. Left, waters sometimes w/pressure.    Subjective    HPI HPI     Eye Pain    Additional comments: x2 weeks. Left, waters sometimes w/pressure.       Last edited by Dollene Primrose, CMA on 07/12/2023  2:03 PM.      Left eye  Reports having pain along the forehead and temple over left eye  States this seems to start every morning around 10 AM for the past 2 weeks  States it feels like there is pressure behind the eye and sometimes there is watery eyes on that side He reports mild photophobia Sometimes has a "foul smell" coming from right side  Pain level and character: 8/10 achy pain and pressure Interventions: Ibuprofen - helps a bit   He denies previously having anything like this before - has had sinus pressure but not like this    Medications: Outpatient Medications Prior to Visit  Medication Sig   ibuprofen (ADVIL) 800 MG tablet Take 1 tablet (800 mg total) by mouth every 8 (eight) hours as needed for moderate pain.   rosuvastatin (CRESTOR) 20 MG tablet Take 1 tablet by mouth once daily   No facility-administered medications prior to visit.    Review of Systems  Constitutional:  Negative for chills and fever.  Eyes:  Positive for photophobia, pain and discharge. Negative for redness, itching and visual disturbance.  Neurological:  Positive for headaches. Negative for dizziness and light-headedness.        Objective    BP 118/70   Pulse 85   Resp 16   Ht 5\' 10"  (1.778 m)   Wt 186 lb (84.4 kg)   SpO2 97%   BMI 26.69 kg/m     Physical Exam Vitals reviewed.  Constitutional:      General: He is awake.      Appearance: Normal appearance. He is well-developed and well-groomed.  HENT:     Head: Normocephalic and atraumatic.     Comments: No temporal bruits bilaterally     Nose: Nose normal.  Eyes:     General: Lids are normal. Gaze aligned appropriately.     Extraocular Movements: Extraocular movements intact.     Right eye: Normal extraocular motion and no nystagmus.     Left eye: Normal extraocular motion.     Conjunctiva/sclera: Conjunctivae normal.     Pupils: Pupils are equal, round, and reactive to light.     Comments: Mild swelling along left lower lid   Pulmonary:     Effort: Pulmonary effort is normal.  Musculoskeletal:     Cervical back: Normal range of motion and neck supple.  Neurological:     Mental Status: He is alert.  Psychiatric:        Behavior: Behavior is cooperative.       No results found for any visits on 07/12/23.  Assessment & Plan      No follow-ups on file.      Problem List Items Addressed This Visit   None Visit Diagnoses     Episodic cluster  headache, not intractable    -  Primary   Relevant Medications   SUMAtriptan (IMITREX) 50 MG tablet     Acute, recurrent, new concern Patient reports left temple pain along left eye as well as pressure sensation that recurs almost daily at around the same time. He reports lacrimation from left eye but states that the right side of his face is typically normal Given HPI and physical exam I suspect he is likely having cluster headaches Will try sumatriptan 50 mg p.o. daily as needed.  Patient can take a second tablet as needed if symptoms are not fully relieved within 2 hours.  Reviewed that he should not take more than 2 doses per 24-hour period Follow-up as needed for progressing or persistent symptoms   No follow-ups on file.   I, Raneem Mendolia E Shemica Meath, PA-C, have reviewed all documentation for this visit. The documentation on 07/12/23 for the exam, diagnosis, procedures, and orders are all accurate and  complete.   Jacquelin Hawking, MHS, PA-C [facility]

## 2023-07-12 NOTE — Patient Instructions (Addendum)
I have sent in a script for a headache medication called Sumatriptan 50 mg - you can take one, and if you still have a headache after 2 hours you can take a second dose  DO NOT TAKE MORE THAN 2 DOSES IN A 24 HOUR PERIOD  You can take Tylenol and ibuprofen as needed for pain as well  Stay well hydrated and let us know if you are still having symptoms or worsening headaches after 5-7 days

## 2023-07-13 ENCOUNTER — Ambulatory Visit: Payer: Self-pay

## 2023-07-13 ENCOUNTER — Ambulatory Visit
Admission: EM | Admit: 2023-07-13 | Discharge: 2023-07-13 | Disposition: A | Discharge status: Home / Self Care | Payer: BC Managed Care – PPO | Attending: Physician Assistant | Admitting: Physician Assistant

## 2023-07-13 DIAGNOSIS — J0120 Acute ethmoidal sinusitis, unspecified: Secondary | ICD-10-CM

## 2023-07-13 MED ORDER — AMOXICILLIN-POT CLAVULANATE 875-125 MG PO TABS: 1.0000 | ORAL_TABLET | Freq: Two times a day (BID) | ORAL | 0 refills | Status: DC

## 2023-07-13 NOTE — ED Triage Notes (Signed)
Pt states he went to his pcp yesterday for headache on left side of head that comes and goes with pressure behind left eye and states he is sensitive to light when the headache comes x 2 weeks. His provider prescribed sumatriptan yesterday and states he has not felt any relief of headaches yet. Pt states he feels like his right eye is swelling that started today.   Pt denies nausea, vomiting, blurry vision and dizziness.

## 2023-07-13 NOTE — Discharge Instructions (Signed)
Return if any problems.

## 2023-07-13 NOTE — Telephone Encounter (Signed)
  Chief Complaint: Left eye lid swollen Symptoms: left eye lid swollen, pressure behind eye Frequency: today Pertinent Negatives: Patient denies fever, pain, itching Disposition: [] ED /[x] Urgent Care (no appt availability in office) / [] Appointment(In office/virtual)/ []  [place]/ [] Home Care/ [] Refused Recommended Disposition /[] [facility]/ []  Follow-up with PCP Additional Notes: Pt was seen in office yesterday for sinus pressure. Today pt has left eye swelling. Pt thinks this is a sinus issue. No appt availble in office pt will go to UC.    Summary: Left Eye Swollen Advice   Pt is calling to report that he was seen in office yesterday for Episodic cluster headache, not intractable prescribed SUMAtriptan (IMITREX) 50 MG tablet [[id number]]. Pt reporting that he had the headache again today. Took ibuprufen. Headache decreased. But left eye is swollen. Please advise     Reason for Disposition  [1] SEVERE eyelid swelling on one side AND [2] sinus pain or pressure  Answer Assessment - Initial Assessment Questions 1. ONSET: "When did the swelling start?" (e.g., minutes, hours, days)     Upper lid - today 2. LOCATION: "What part of the eyelids is swollen?"     Upper eye lid left eye 3. SEVERITY: "How swollen is it?"     moderate 4. ITCHING: "Is there any itching?" If Yes, ask: "How much?"   (Scale 1-10; mild, moderate or severe)     no 5. PAIN: "Is the swelling painful to touch?" If Yes, ask: "How painful is it?"   (Scale 1-10; mild, moderate or severe)     no 6. FEVER: "Do you have a fever?" If Yes, ask: "What is it, how was it measured, and when did it start?"      no 7. CAUSE: "What do you think is causing the swelling?"     sinus 8. RECURRENT SYMPTOM: "Have you had eyelid swelling before?" If Yes, ask: "When was the last time?" "What happened that time?"     no 9. OTHER SYMPTOMS: "Do you have any other symptoms?" (e.g., blurred vision, eye discharge, rash,  runny nose)     Eye runs, aching , sensitive to light.  Protocols used: Eye - Swelling-A-AH

## 2023-07-13 NOTE — ED Provider Notes (Signed)
Nicolas White    CSN: [id number] Arrival date & time: 07/13/23  1545      History   Chief Complaint Chief Complaint  Patient presents with   Headache    HPI Nicolas White is a 47 y.o. male.   Patient complains of a sinus headache.  Patient reports he has had a history of sinus infections and has had symptoms like this in the past.  Patient reports the last time he had the symptoms he was treated with an antibiotic that resolved the symptoms.  Patient reports he saw primary care yesterday for pain in his left forehead.  Patient reports he was given a prescription for Imitrex.  Patient reports this has helped his headaches some but he is concerned that his headache is coming from infection in his sinuses.  Patient denies any cough or congestion.  Patient denies any fever or chills.  He has not been around anyone who has had flu or COVID.   Headache   Past Medical History:  Diagnosis Date   Allergy    Hyperlipidemia    Hypertrophy, tongue, papillae    OSA (obstructive sleep apnea) 05/27/2019    Patient Active Problem List   Diagnosis Date Noted   Shingles 02/23/2021   OSA (obstructive sleep apnea) 05/27/2019   Erythrocytosis 12/18/2017   Hyperlipidemia 08/27/2015   Displacement of cervical intervertebral disc without myelopathy 08/26/2015   Calculus of kidney 08/26/2015   Cervical radiculitis 08/26/2015   Hypertrophy of tongue papillae 08/26/2015   Allergic rhinitis, seasonal 10/22/2008    History reviewed. No pertinent surgical history.     Home Medications    Prior to Admission medications   Medication Sig Start Date End Date Taking? Authorizing Provider  ibuprofen (ADVIL) 800 MG tablet Take 1 tablet (800 mg total) by mouth every 8 (eight) hours as needed for moderate pain. 08/15/22  Yes Sowles, Danna Hefty, MD  rosuvastatin (CRESTOR) 20 MG tablet Take 1 tablet by mouth once daily 02/21/23  Yes Sowles, Danna Hefty, MD  SUMAtriptan (IMITREX) 50 MG tablet Take 1  tablet (50 mg total) by mouth every 2 (two) hours as needed for migraine. May repeat in 2 hours if headache persists or recurs. Do not exceed 2 doses in 24 hours. 07/12/23  Yes Mecum, Erin E, PA-C  amoxicillin-clavulanate (AUGMENTIN) 875-125 MG tablet Take 1 tablet by mouth 2 (two) times daily. 07/13/23  Yes Elson Areas, PA-C    Family History Family History  Problem Relation Age of Onset   Arthritis Mother    Hypertension Mother    Heart attack Mother        hx of light heart attack   Hyperlipidemia Mother    Cancer Father        lung   Kidney disease Brother        had a transplant (hemodialysis)   Stroke Maternal Grandmother    Heart disease Maternal Grandmother    Kidney disease Maternal Grandmother    Prostate cancer Neg Hx     Social History Social History   Tobacco Use   Smoking status: Never   Smokeless tobacco: Current    Types: Chew   Tobacco comments:    patient has chewed tobacco for 25 years  Vaping Use   Vaping status: Never Used  Substance Use Topics   Alcohol use: No    Alcohol/week: 0.0 standard drinks of alcohol   Drug use: No     Allergies   Patient has no known allergies.  Review of Systems Review of Systems  Neurological:  Positive for headaches.  All other systems reviewed and are negative.    Physical Exam Triage Vital Signs ED Triage Vitals  Encounter Vitals Group     BP 07/13/23 1628 132/78     Systolic BP Percentile --      Diastolic BP Percentile --      Pulse Rate 07/13/23 1628 91     Resp 07/13/23 1628 14     Temp 07/13/23 1628 98.3 F (36.8 C)     Temp Source 07/13/23 1628 Oral     SpO2 07/13/23 1628 95 %     Weight --      Height --      Head Circumference --      Peak Flow --      Pain Score 07/13/23 1629 0     Pain Loc --      Pain Education --      Exclude from Growth Chart --    No data found.  Updated Vital Signs BP 132/78 (BP Location: Left Arm)   Pulse 91   Temp 98.3 F (36.8 C) (Oral)   Resp 14    SpO2 95%   Visual Acuity Right Eye Distance:   Left Eye Distance:   Bilateral Distance:    Right Eye Near:   Left Eye Near:    Bilateral Near:     Physical Exam Vitals and nursing note reviewed.  Constitutional:      Appearance: He is well-developed.  HENT:     Head: Normocephalic.     Comments: Tender maxillary sinuses bilaterally, tender  frontal sinuses. Eyes:     Extraocular Movements: Extraocular movements intact.     Pupils: Pupils are equal, round, and reactive to light.  Cardiovascular:     Rate and Rhythm: Normal rate.  Pulmonary:     Effort: Pulmonary effort is normal.  Abdominal:     General: There is no distension.  Musculoskeletal:        General: Normal range of motion.     Cervical back: Normal range of motion.  Skin:    General: Skin is warm.  Neurological:     General: No focal deficit present.     Mental Status: He is alert and oriented to person, place, and time.      UC Treatments / Results  Labs (all labs ordered are listed, but only abnormal results are displayed) Labs Reviewed - No data to display  EKG   Radiology No results found.  Procedures Procedures (including critical care time)  Medications Ordered in UC Medications - No data to display  Initial Impression / Assessment and Plan / UC Course  I have reviewed the triage vital signs and the nursing notes.  Pertinent labs & imaging results that were available during my care of the patient were reviewed by me and considered in my medical decision making (see chart for details).      Final Clinical Impressions(s) / UC Diagnoses   Final diagnoses:  Acute ethmoidal sinusitis, recurrence not specified     Discharge Instructions      Return if any problems.    ED Prescriptions     Medication Sig Dispense Auth. Provider   amoxicillin-clavulanate (AUGMENTIN) 875-125 MG tablet Take 1 tablet by mouth 2 (two) times daily. 20 tablet Elson Areas, [place]      PDMP not  reviewed this encounter. An After Visit Summary was printed and given to the  patient.       Elson Areas, [place] 07/13/23 1708

## 2023-07-16 ENCOUNTER — Encounter: Payer: BC Managed Care – PPO | Admitting: Family Medicine

## 2023-08-14 ENCOUNTER — Encounter: Payer: Self-pay | Admitting: Family Medicine

## 2023-08-14 ENCOUNTER — Ambulatory Visit (INDEPENDENT_AMBULATORY_CARE_PROVIDER_SITE_OTHER): Payer: BC Managed Care – PPO | Admitting: Family Medicine

## 2023-08-14 VITALS — BP 134/82 | HR 83 | Temp 97.7°F | Resp 16 | Ht 70.0 in | Wt 189.4 lb

## 2023-08-14 DIAGNOSIS — D751 Secondary polycythemia: Secondary | ICD-10-CM

## 2023-08-14 DIAGNOSIS — Z0001 Encounter for general adult medical examination with abnormal findings: Secondary | ICD-10-CM

## 2023-08-14 DIAGNOSIS — Z1211 Encounter for screening for malignant neoplasm of colon: Secondary | ICD-10-CM | POA: Diagnosis not present

## 2023-08-14 DIAGNOSIS — R079 Chest pain, unspecified: Secondary | ICD-10-CM

## 2023-08-14 DIAGNOSIS — R7303 Prediabetes: Secondary | ICD-10-CM

## 2023-08-14 DIAGNOSIS — Z23 Encounter for immunization: Secondary | ICD-10-CM

## 2023-08-14 DIAGNOSIS — E7800 Pure hypercholesterolemia, unspecified: Secondary | ICD-10-CM

## 2023-08-14 DIAGNOSIS — Z0000 Encounter for general adult medical examination without abnormal findings: Secondary | ICD-10-CM

## 2023-08-14 DIAGNOSIS — Z79899 Other long term (current) drug therapy: Secondary | ICD-10-CM

## 2023-08-14 NOTE — Progress Notes (Signed)
 Name: Nicolas White   MRN: [id number]    DOB: 11-17-1975   Date:08/14/2023       Progress Note  Subjective  Chief Complaint  Chief Complaint  Patient presents with   Annual Exam    HPI  Patient presents for annual CPE    IPSS     Row Name 08/14/23 1258         International Prostate Symptom Score   How often have you had the sensation of not emptying your bladder? Not at All     How often have you had to urinate less than every two hours? Not at All     How often have you found you stopped and started again several times when you urinated? Not at All     How often have you found it difficult to postpone urination? Not at All     How often have you had a weak urinary stream? Not at All     How often have you had to strain to start urination? Not at All     How many times did you typically get up at night to urinate? None     Total IPSS Score 0       Quality of Life due to urinary symptoms   If you were to spend the rest of your life with your urinary condition just the way it is now how would you feel about that? Delighted              Diet: he eats mostly at home , he tries to eat vegetables and fruit daily , he still eats a lot of fried food  Exercise: he is always moving around  Last Dental Exam: up to date  Last Eye Exam: up to date   Depression: phq 9 is negative    08/14/2023   12:58 PM 07/12/2023    1:55 PM 05/24/2023    1:54 PM 01/04/2023    2:22 PM 09/01/2022    8:17 AM  Depression screen PHQ 2/9  Decreased Interest 0 0 0 0 0  Down, Depressed, Hopeless 0 0 0 0 0  PHQ - 2 Score 0 0 0 0 0  Altered sleeping 0 0 0 0 0  Tired, decreased energy 0 0 0 0 0  Change in appetite 0 0 0 0 0  Feeling bad or failure about yourself  0 0 0 0 0  Trouble concentrating 0 0 0 0 0  Moving slowly or fidgety/restless 0 0 0 0 0  Suicidal thoughts 0 0 0 0 0  PHQ-9 Score 0 0 0 0 0  Difficult doing work/chores Not difficult at all  Not difficult at all Not difficult at all Not  difficult at all    Hypertension:  BP Readings from Last 3 Encounters:  08/14/23 134/82  07/13/23 132/78  07/12/23 118/70    Obesity: Wt Readings from Last 3 Encounters:  08/14/23 189 lb 6.4 oz (85.9 kg)  07/12/23 186 lb (84.4 kg)  05/24/23 183 lb 4.8 oz (83.1 kg)   BMI Readings from Last 3 Encounters:  08/14/23 27.18 kg/m  07/12/23 26.69 kg/m  05/24/23 26.30 kg/m     [employer] Visit [facility] 08/14/2023 in [facility]  AUDIT-C Score 0       Married STD testing and prevention (HIV/chl/gon/syphilis):  not applicable Sexual history: no problems, one partner  Hep C Screening: completed Skin cancer: Discussed monitoring for atypical lesions Colorectal cancer:  discussed going to have cologuard done  Prostate cancer:  not applicable  Lung cancer:  Low Dose CT Chest recommended if Age 97-80 years, 30 pack-year currently smoking OR have quit w/in 15years. Patient  is not a candidate for screening   AAA: The USPSTF recommends one-time screening with ultrasonography in men ages 76 to 75 years who have ever smoked. Patient   is not a candidate for screening  ECG:  2020  Vaccines:  RSV: not applicable HPV: not applicable Tdap: completed Shingrix: not applicable Pneumonia: not applicable Flu: today  COVID-19: completed  Advanced Care Planning: A voluntary discussion about advance care planning including the explanation and discussion of advance directives.  Discussed health care proxy and Living will, and the patient was able to identify a health care proxy as wife .  Patient does have a living will and power of attorney of health care   Patient Active Problem List   Diagnosis Date Noted   Shingles 02/23/2021   OSA (obstructive sleep apnea) 05/27/2019   Erythrocytosis 12/18/2017   Hyperlipidemia 08/27/2015   Displacement of cervical intervertebral disc without myelopathy 08/26/2015   Calculus of kidney 08/26/2015   Cervical radiculitis  08/26/2015   Hypertrophy of tongue papillae 08/26/2015   Allergic rhinitis, seasonal 10/22/2008    History reviewed. No pertinent surgical history.  Family History  Problem Relation Age of Onset   Arthritis Mother    Hypertension Mother    Heart attack Mother        hx of light heart attack   Hyperlipidemia Mother    Cancer Father        lung   Kidney disease Brother        had a transplant (hemodialysis)   Stroke Maternal Grandmother    Heart disease Maternal Grandmother    Kidney disease Maternal Grandmother    Prostate cancer Neg Hx     Social History   Socioeconomic History   Marital status: Married    Spouse name: Hadassah    Number of children: 3   Years of education: Not on file   Highest education level: Some college, no degree  Occupational History   Occupation: market researcher   Tobacco Use   Smoking status: Never   Smokeless tobacco: Current    Types: Chew   Tobacco comments:    patient has chewed tobacco for 25 years  Vaping Use   Vaping status: Never Used  Substance and Sexual Activity   Alcohol use: No    Alcohol/week: 0.0 standard drinks of alcohol   Drug use: No   Sexual activity: Yes    Partners: Female  Other Topics Concern   Not on file  Social History Narrative   Not on file   Social Drivers of Health   Financial Resource Strain: Low Risk  (08/14/2023)   Overall Financial Resource Strain (CARDIA)    Difficulty of Paying Living Expenses: Not hard at all  Food Insecurity: No Food Insecurity (08/14/2023)   Hunger Vital Sign    Worried About Running Out of Food in the Last Year: Never true    Ran Out of Food in the Last Year: Never true  Transportation Needs: No Transportation Needs (08/14/2023)   PRAPARE - Administrator, Civil Service (Medical): No    Lack of Transportation (Non-Medical): No  Physical Activity: Insufficiently Active (08/14/2023)   Exercise Vital Sign    Days of Exercise per Week: 3 days    Minutes of Exercise per  Session: 30 min  Stress: No Stress Concern Present (08/14/2023)   [employer] of Occupational Health - Occupational Stress Questionnaire    Feeling of Stress : Not at all  Social Connections: Moderately Integrated (08/14/2023)   Social Connection and Isolation Panel [NHANES]    Frequency of Communication with Friends and Family: More than three times a week    Frequency of Social Gatherings with Friends and Family: More than three times a week    Attends Religious Services: 1 to 4 times per year    Active Member of [employer] or Organizations: No    Attends Banker Meetings: Never    Marital Status: Married  Catering Manager Violence: Not At Risk (08/14/2023)   Humiliation, Afraid, Rape, and Kick questionnaire    Fear of Current or Ex-Partner: No    Emotionally Abused: No    Physically Abused: No    Sexually Abused: No     Current Outpatient Medications:    ibuprofen  (ADVIL ) 800 MG tablet, Take 1 tablet (800 mg total) by mouth every 8 (eight) hours as needed for moderate pain., Disp: 90 tablet, Rfl: 0   rosuvastatin  (CRESTOR ) 20 MG tablet, Take 1 tablet by mouth once daily, Disp: 90 tablet, Rfl: 1   SUMAtriptan  (IMITREX ) 50 MG tablet, Take 1 tablet (50 mg total) by mouth every 2 (two) hours as needed for migraine. May repeat in 2 hours if headache persists or recurs. Do not exceed 2 doses in 24 hours. (Patient not taking: Reported on 08/14/2023), Disp: 10 tablet, Rfl: 0  No Known Allergies   ROS  Constitutional: Negative for fever or weight change.  Respiratory: Negative for cough and shortness of breath.   Cardiovascular: Negative for chest pain or palpitations.  Gastrointestinal: Negative for abdominal pain, no bowel changes.  Musculoskeletal: Negative for gait problem or joint swelling.  Skin: Negative for rash.  Neurological: Negative for dizziness or headache.  No other specific complaints in a complete review of systems (except as listed in HPI above).     Objective  Vitals:   08/14/23 1301  BP: 134/82  Pulse: 83  Resp: 16  Temp: 97.7 F (36.5 C)  TempSrc: Oral  SpO2: 97%  Weight: 189 lb 6.4 oz (85.9 kg)  Height: 5' 10 (1.778 m)    Body mass index is 27.18 kg/m.  Physical Exam  Constitutional: Patient appears well-developed and well-nourished. No distress.  HENT: Head: Normocephalic and atraumatic. Ears: B TMs ok, no erythema or effusion; Nose: Nose normal. Mouth/Throat: Oropharynx is clear and moist. No oropharyngeal exudate.  Eyes: Conjunctivae and EOM are normal. Pupils are equal, round, and reactive to light. No scleral icterus.  Neck: Normal range of motion. Neck supple. No JVD present. No thyromegaly present.  Cardiovascular: Normal rate, regular rhythm and normal heart sounds.  No murmur heard. No BLE edema. Pulmonary/Chest: Effort normal and breath sounds normal. No respiratory distress. Abdominal: Soft. Bowel sounds are normal, no distension. There is no tenderness. no masses MALE GENITALIA: Normal descended testes bilaterally, but higher on left side, no masses palpated, no hernias, no lesions, no discharge RECTAL: not done  Musculoskeletal: Normal range of motion, no joint effusions. No gross deformities Neurological: he is alert and oriented to person, place, and time. No cranial nerve deficit. Coordination, balance, strength, speech and gait are normal.  Skin: Skin is warm and dry. No rash noted. No erythema.  Psychiatric: Patient has a normal mood and affect. behavior is normal. Judgment and thought content normal.  Assessment & Plan 1. Well adult exam (Primary)  - Cologuard - CBC with Differential/Platelet - COMPLETE METABOLIC PANEL WITH GFR - Lipid panel - Hemoglobin A1c  2. Screening for colon cancer  - Cologuard  3. Need for influenza vaccination  - Flu vaccine trivalent PF, 6mos and older(Flulaval,Afluria,Fluarix,Fluzone)  4. Pre-diabetes  - Hemoglobin A1c  5. Pure  hypercholesterolemia  - Lipid panel  6. Long-term use of high-risk medication  - CBC with Differential/Platelet - COMPLETE METABOLIC PANEL WITH GFR  7. Erythrocytosis  - CBC with Differential/Platelet  8. Intermittent chest pain  Going on for the past month, but last episode about one week ago, lasts about 15 minutes, advised to return for follow up   -Prostate cancer screening and PSA options (with potential risks and benefits of testing vs not testing) were discussed along with recent recs/guidelines. -USPSTF grade A and B recommendations reviewed with patient; age-appropriate recommendations, preventive care, screening tests, etc discussed and encouraged; healthy living encouraged; see AVS for patient education given to patient -Discussed importance of 150 minutes of physical activity weekly, eat two servings of fish weekly, eat one serving of tree nuts ( cashews, pistachios, pecans, almonds.SABRA) every other day, eat 6 servings of fruit/vegetables daily and drink plenty of water and avoid sweet beverages.  -Reviewed Health Maintenance: yes

## 2023-08-15 LAB — CBC WITH DIFFERENTIAL/PLATELET
Absolute Lymphocytes: 2210 {cells}/uL (ref 850–3900)
Absolute Monocytes: 625 {cells}/uL (ref 200–950)
Basophils Absolute: 42 {cells}/uL (ref 0–200)
Basophils Relative: 0.8 %
Eosinophils Absolute: 69 {cells}/uL (ref 15–500)
Eosinophils Relative: 1.3 %
HCT: 55.3 % — ABNORMAL HIGH (ref 38.5–50.0)
Hemoglobin: 19.3 g/dL — ABNORMAL HIGH (ref 13.2–17.1)
MCH: 30.5 pg (ref 27.0–33.0)
MCHC: 34.9 g/dL (ref 32.0–36.0)
MCV: 87.5 fL (ref 80.0–100.0)
MPV: 10.1 fL (ref 7.5–12.5)
Monocytes Relative: 11.8 %
Neutro Abs: 2353 {cells}/uL (ref 1500–7800)
Neutrophils Relative %: 44.4 %
Platelets: 239 10*3/uL (ref 140–400)
RBC: 6.32 10*6/uL — ABNORMAL HIGH (ref 4.20–5.80)
RDW: 13.1 % (ref 11.0–15.0)
Total Lymphocyte: 41.7 %
WBC: 5.3 10*3/uL (ref 3.8–10.8)

## 2023-08-15 LAB — COMPLETE METABOLIC PANEL WITH GFR
AG Ratio: 1.5 (calc) (ref 1.0–2.5)
ALT: 33 U/L (ref 9–46)
AST: 26 U/L (ref 10–40)
Albumin: 4.8 g/dL (ref 3.6–5.1)
Alkaline phosphatase (APISO): 106 U/L (ref 36–130)
BUN: 15 mg/dL (ref 7–25)
CO2: 29 mmol/L (ref 20–32)
Calcium: 9.8 mg/dL (ref 8.6–10.3)
Chloride: 101 mmol/L (ref 98–110)
Creat: 1.01 mg/dL (ref 0.60–1.29)
Globulin: 3.1 g/dL (ref 1.9–3.7)
Glucose, Bld: 80 mg/dL (ref 65–99)
Potassium: 4 mmol/L (ref 3.5–5.3)
Sodium: 139 mmol/L (ref 135–146)
Total Bilirubin: 0.5 mg/dL (ref 0.2–1.2)
Total Protein: 7.9 g/dL (ref 6.1–8.1)
eGFR: 92 mL/min/{1.73_m2} (ref >=60)

## 2023-08-15 LAB — HIGH SENSITIVITY CRP: hs-CRP: 2.2 mg/L

## 2023-08-15 LAB — LIPID PANEL
Cholesterol: 165 mg/dL (ref <200)
HDL: 44 mg/dL (ref >=40)
LDL Cholesterol (Calc): 100 mg/dL — ABNORMAL HIGH
Non-HDL Cholesterol (Calc): 121 mg/dL (ref <130)
Total CHOL/HDL Ratio: 3.8 (calc) (ref <5.0)
Triglycerides: 113 mg/dL (ref <150)

## 2023-08-15 LAB — HEMOGLOBIN A1C
Hgb A1c MFr Bld: 6.2 %{Hb} — ABNORMAL HIGH (ref <5.7)
Mean Plasma Glucose: 131 mg/dL
eAG (mmol/L): 7.3 mmol/L

## 2023-08-28 ENCOUNTER — Ambulatory Visit: Payer: BC Managed Care – PPO | Admitting: Family Medicine

## 2023-09-11 ENCOUNTER — Ambulatory Visit: Payer: BC Managed Care – PPO | Admitting: Family Medicine

## 2023-09-21 ENCOUNTER — Other Ambulatory Visit: Payer: Self-pay | Admitting: Family Medicine

## 2023-09-21 DIAGNOSIS — E7800 Pure hypercholesterolemia, unspecified: Secondary | ICD-10-CM

## 2023-09-24 NOTE — Telephone Encounter (Signed)
 Lvm letting pt know that prescription have been sent to pharmacy, however, provider has asked that he schedule appointment for regular follow up

## 2023-12-28 ENCOUNTER — Encounter: Payer: Self-pay | Admitting: Family Medicine

## 2023-12-28 ENCOUNTER — Ambulatory Visit: Admitting: Family Medicine

## 2023-12-28 VITALS — BP 124/80 | HR 87 | Resp 16 | Ht 70.0 in | Wt 178.0 lb

## 2023-12-28 DIAGNOSIS — J329 Chronic sinusitis, unspecified: Secondary | ICD-10-CM

## 2023-12-28 DIAGNOSIS — R718 Other abnormality of red blood cells: Secondary | ICD-10-CM

## 2023-12-28 DIAGNOSIS — G4733 Obstructive sleep apnea (adult) (pediatric): Secondary | ICD-10-CM | POA: Diagnosis not present

## 2023-12-28 DIAGNOSIS — R42 Dizziness and giddiness: Secondary | ICD-10-CM

## 2023-12-28 NOTE — Progress Notes (Signed)
 Name: AADVIK White   MRN: [id number]    DOB: 05-08-1976   Date:12/28/2023       Progress Note  Subjective  Chief Complaint  Chief Complaint  Patient presents with   Dizziness    Started Mon on/off randomly episodes. Wed-today improved    Discussed the use of AI scribe software for clinical note transcription with the patient, who gave verbal consent to proceed.  History of Present Illness Nicolas White "Nicolas White" is a 48 year old male who presents with dizziness and vertigo.  He experiences dizziness and a spinning sensation that began on Monday when he got up for work. The dizziness is severe, causing concern while in the bathroom, and feels as if he is going to fall.  On Tuesday, while cutting grass on a mower, he experienced another episode of dizziness accompanied by sweating and nausea. He stopped the mower and his wife assisted him. The dizziness can occur even when standing still and does not require head movement to be triggered. No hearing issues, ringing in the ears, or numbness and weakness in the arms or legs.  He has a history of sinusitis, with symptoms predominantly on the left side of his face, including headaches and soreness around the left eye. These symptoms have been ongoing for about two to three months. He reports yellow drainage from the left side, and he has had similar sinus issues in the past, with episodes in December and May of the previous year.  He has sleep apnea but has never used a CPAP machine due to not obtaining a mask.   He has stopped drinking soft drinks and is cutting down on sweets due to prediabetes concerns causing the dizziness . No alcohol consumption.    Patient Active Problem List   Diagnosis Date Noted   Shingles 02/23/2021   OSA (obstructive sleep apnea) 05/27/2019   Erythrocytosis 12/18/2017   Hyperlipidemia 08/27/2015   Displacement of cervical intervertebral disc without myelopathy 08/26/2015   Calculus of kidney 08/26/2015    Cervical radiculitis 08/26/2015   Hypertrophy of tongue papillae 08/26/2015   Allergic rhinitis, seasonal 10/22/2008    Social History   Tobacco Use   Smoking status: Never   Smokeless tobacco: Current    Types: Chew   Tobacco comments:    patient has chewed tobacco for 25 years  Substance Use Topics   Alcohol use: No    Alcohol/week: 0.0 standard drinks of alcohol     Current Outpatient Medications:    ibuprofen  (ADVIL ) 800 MG tablet, Take 1 tablet (800 mg total) by mouth every 8 (eight) hours as needed for moderate pain., Disp: 90 tablet, Rfl: 0   rosuvastatin  (CRESTOR ) 20 MG tablet, Take 1 tablet by mouth once daily, Disp: 90 tablet, Rfl: 0   SUMAtriptan  (IMITREX ) 50 MG tablet, Take 1 tablet (50 mg total) by mouth every 2 (two) hours as needed for migraine. May repeat in 2 hours if headache persists or recurs. Do not exceed 2 doses in 24 hours. (Patient not taking: Reported on 12/28/2023), Disp: 10 tablet, Rfl: 0  No Known Allergies  ROS  Ten systems reviewed and is negative except as mentioned in HPI    Objective  Vitals:   12/28/23 1321  BP: 124/80  Pulse: 87  Resp: 16  SpO2: 97%  Weight: 178 lb (80.7 kg)  Height: 5\' 10"  (1.778 m)    Body mass index is 25.54 kg/m.  Physical Exam CONSTITUTIONAL: Patient appears well-developed and well-nourished. No  distress. HEENT: Head atraumatic, normocephalic, neck supple. Throat normal. Tympanic membranes normal bilaterally. No sinus tenderness. CARDIOVASCULAR: Normal rate, regular rhythm and normal heart sounds. No murmur heard. No BLE edema. PULMONARY: Effort normal and breath sounds normal. No respiratory distress. PSYCHIATRIC: Patient has a normal mood and affect. Behavior is normal. Judgment and thought content normal. NEUROLOGICAL: Neurological exam normal. Romberg negative. Cranial nerves intact. Normal grip and sensation . No nystagmus   Assessment & Plan Vertigo Intermittent dizziness with spinning sensation  likely due to inner ear issue. Neurological causes unlikely. - Refer to ENT for evaluation of vertigo and possible inner ear involvement. - Advise hydration and avoidance of fall-risk activities until symptoms resolve.  Chronic sinusitis Recurrent sinusitis primarily on the left side, persisting for 2-3 months. - Refer to ENT for evaluation of recurrent sinusitis.  Obstructive sleep apnea Non-compliance with CPAP therapy due to lack of mask. Elevated RBC count possibly related to untreated sleep apnea. - Discuss CPAP therapy and mask acquisition to manage sleep apnea and prevent complications.  Prediabetes Prediabetes with emphasis on lifestyle modification to prevent progression. - Advise reduction of sugar intake and maintenance of a healthy diet.

## 2024-01-11 ENCOUNTER — Other Ambulatory Visit: Payer: Self-pay | Admitting: Family Medicine

## 2024-01-11 DIAGNOSIS — E7800 Pure hypercholesterolemia, unspecified: Secondary | ICD-10-CM

## 2024-02-06 ENCOUNTER — Telehealth: Payer: Self-pay | Admitting: Family Medicine

## 2024-02-06 DIAGNOSIS — E7800 Pure hypercholesterolemia, unspecified: Secondary | ICD-10-CM

## 2024-02-06 MED ORDER — ROSUVASTATIN CALCIUM 20 MG PO TABS: 20.0000 mg | ORAL_TABLET | Freq: Every day | ORAL | 0 refills | Status: DC

## 2024-02-06 NOTE — Telephone Encounter (Signed)
 Copied from CRM [id number]. Topic: Clinical - Medication Refill >> Feb 06, 2024 11:16 AM Tiffini S wrote: Medication: rosuvastatin  (CRESTOR ) 20 MG tablet  Has the patient contacted their pharmacy? No (Agent: If no, request that the patient contact the pharmacy for the refill. If patient does not wish to contact the pharmacy document the reason why and proceed with request.) (Agent: If yes, when and what did the pharmacy advise?)  This is the patient's preferred pharmacy:  [facility] [address] [place] - [address] [place] [address] Phone: [phone] Fax: [phone]  Is this the correct pharmacy for this prescription? Yes If no, delete pharmacy and type the correct one.   Has the prescription been filled recently? No  Is the patient out of the medication? Yes, patient have three tablet left   Has the patient been seen for an appointment in the last year OR does the patient have an upcoming appointment? Yes  Can we respond through MyChart? No, patient prefer phone calls on cell number   Agent: Please be advised that Rx refills may take up to 3 business days. We ask that you follow-up with your pharmacy.

## 2024-02-06 NOTE — Telephone Encounter (Signed)
 Prescription filled to [facility]

## 2024-03-19 ENCOUNTER — Other Ambulatory Visit: Payer: Self-pay | Admitting: Family Medicine

## 2024-03-19 DIAGNOSIS — M545 Low back pain, unspecified: Secondary | ICD-10-CM

## 2024-03-19 MED ORDER — IBUPROFEN 800 MG PO TABS: 800.0000 mg | ORAL_TABLET | Freq: Three times a day (TID) | ORAL | 0 refills | Status: AC | PRN

## 2024-03-19 NOTE — Telephone Encounter (Signed)
 Copied from CRM [id number]. Topic: Clinical - Medication Refill >> Mar 19, 2024  2:36 PM Larissa S wrote: Medication:  ibuprofen  (ADVIL ) 800 MG tablet  Has the patient contacted their pharmacy? Yes (Agent: If no, request that the patient contact the pharmacy for the refill. If patient does not wish to contact the pharmacy document the reason why and proceed with request.) (Agent: If yes, when and what did the pharmacy advise?)  This is the patient's preferred pharmacy:  [facility] [address] [place] - [address] [place] [address] Phone: [phone] Fax: [phone]  Is this the correct pharmacy for this prescription? Yes If no, delete pharmacy and type the correct one.   Has the prescription been filled recently? No  Is the patient out of the medication? Yes  Has the patient been seen for an appointment in the last year OR does the patient have an upcoming appointment? Yes  Can we respond through MyChart? No  Agent: Please be advised that Rx refills may take up to 3 business days. We ask that you follow-up with your pharmacy.

## 2024-05-03 ENCOUNTER — Other Ambulatory Visit: Payer: Self-pay | Admitting: Family Medicine

## 2024-05-03 DIAGNOSIS — E7800 Pure hypercholesterolemia, unspecified: Secondary | ICD-10-CM

## 2024-06-26 ENCOUNTER — Ambulatory Visit: Payer: Self-pay

## 2024-06-26 NOTE — Telephone Encounter (Signed)
 FYI Only or Action Required?: Action required by provider: clinical question for provider and refused acute appointment.  Patient was last seen in primary care on 12/28/2023 by Glenard Mire, MD.  Called Nurse Triage reporting Cough.  Symptoms began a week ago.  Interventions attempted: OTC medications: Robitussin and Rest, hydration, or home remedies.  Symptoms are: unchanged.  Triage Disposition: Home Care  Patient/caregiver understands and will follow disposition?: No, wishes to speak with PCP   Copied from CRM #[id number]. Topic: Clinical - Red Word Triage >> Jun 26, 2024  9:50 AM Charlet HERO wrote: Red Word that prompted transfer to Nurse Triage: Patient is calling with persistent cough that he has had for over a week that he can not get rid of, he has yellow mucus. Dr Glenard Lobo Reason for Disposition  Cough  Answer Assessment - Initial Assessment Questions Additional info: Declines acute appointment, would like prescription for cough suppressant to [facility] [address]  Robitussin with minimal relief, requesting cough medicine to calm his cough so he can sleep. He feel good, no distress, cough is annoying and unable to sleep.     1. ONSET: When did the cough begin?      One week  2. SEVERITY: How bad is the cough today?      Moderate at night 3. SPUTUM: Describe the color of your sputum (e.g., none, dry cough; clear, white, yellow, green)     Scant yellow 4. HEMOPTYSIS: Are you coughing up any blood? If Yes, ask: How much? (e.g., flecks, streaks, tablespoons, etc.)     Denies  5. DIFFICULTY BREATHING: Are you having difficulty breathing? If Yes, ask: How bad is it? (e.g., mild, moderate, severe)      Denies  6. FEVER: Do you have a fever? If Yes, ask: What is your temperature, how was it measured, and when did it start?     Denies  7. CARDIAC HISTORY: Do you have any history of heart disease? (e.g., heart attack, congestive heart failure)        8. LUNG HISTORY: Do you have any history of lung disease?  (e.g., pulmonary embolus, asthma, emphysema)      9. PE RISK FACTORS: Do you have a history of blood clots? (or: recent major surgery, recent prolonged travel, bedridden)      10. OTHER SYMPTOMS: Do you have any other symptoms? (e.g., runny nose, wheezing, chest pain)       Nasal congestion  11. PREGNANCY: Is there any chance you are pregnant? When was your last menstrual period?        12. TRAVEL: Have you traveled out of the country in the last month? (e.g., travel history, exposures)  Protocols used: Cough - Acute Productive-A-AH

## 2024-06-26 NOTE — Telephone Encounter (Signed)
 Lvm letting pt know to schedule appt if we are booked then he have the option of going to urgent care.

## 2024-06-27 ENCOUNTER — Encounter: Payer: Self-pay | Admitting: Oncology

## 2024-06-27 ENCOUNTER — Ambulatory Visit: Payer: Self-pay

## 2024-06-27 ENCOUNTER — Ambulatory Visit: Payer: PRIVATE HEALTH INSURANCE

## 2024-06-27 VITALS — BP 98/63 | HR 82 | Resp 16 | Ht 69.0 in | Wt 182.8 lb

## 2024-06-27 DIAGNOSIS — J0100 Acute maxillary sinusitis, unspecified: Secondary | ICD-10-CM

## 2024-06-27 LAB — POCT INFLUENZA A/B
Influenza A, POC: NEGATIVE
Influenza B, POC: NEGATIVE

## 2024-06-27 LAB — POC COVID19 BINAXNOW: SARS Coronavirus 2 Ag: NEGATIVE

## 2024-06-27 MED ORDER — AMOXICILLIN-POT CLAVULANATE 875-125 MG PO TABS: 1.0000 | ORAL_TABLET | Freq: Two times a day (BID) | ORAL | 0 refills | Status: AC

## 2024-06-27 NOTE — Progress Notes (Signed)
 Acute visit   Patient: Nicolas White   DOB: 07-31-1976   48 y.o. Male  MRN: [id number] PCP: Sowles, Krichna, MD   Chief Complaint  Patient presents with   Acute Visit    Possible cold/flu?   Subjective    Discussed the use of AI scribe software for clinical note transcription with the patient, who gave verbal consent to proceed.  History of Present Illness Nicolas White is a 48 year old male who presents with symptoms suggestive of a sinus infection.  Symptoms began approximately two weeks ago and have not improved. He has persistent nasal congestion, particularly on the left side, with thick yellow mucus when blowing his nose. He experiences a light ache around his eye and watery eyes. No pain when pressing on the area, but his nose has been stocked up for the past six to seven months.  He has a slight headache and a cough, which causes discomfort in the sinus area. No sore throat, nausea, vomiting, fever, or chills.  He has a history of occasional sinus infections and recalls being prescribed medication in the past. He has been taking cough syrup but finds it unhelpful. He recently purchased calcium  supplements and plans to start taking them. He is not currently taking any allergy medications.   Review of systems as noted in HPI.   Objective    BP 98/63 (BP Location: Left Arm, Patient Position: Sitting, Cuff Size: Normal)   Pulse 82   Resp 16   Ht 5' 9 (1.753 m)   Wt 182 lb 12.8 oz (82.9 kg)   SpO2 98%   BMI 26.99 kg/m  Physical Exam Constitutional:      Appearance: Normal appearance.  HENT:     Head: Normocephalic and atraumatic.     Right Ear: Tympanic membrane, ear canal and external ear normal.     Left Ear: Tympanic membrane, ear canal and external ear normal.     Nose: Congestion and rhinorrhea present.     Right Sinus: Maxillary sinus tenderness present.     Left Sinus: Maxillary sinus tenderness present.     Mouth/Throat:     Mouth: Mucous  membranes are moist.     Pharynx: Posterior oropharyngeal erythema present. No oropharyngeal exudate.  Eyes:     Pupils: Pupils are equal, round, and reactive to light.  Cardiovascular:     Rate and Rhythm: Normal rate and regular rhythm.  Pulmonary:     Effort: Pulmonary effort is normal. No respiratory distress.     Breath sounds: Normal breath sounds. No wheezing.  Skin:    General: Skin is warm.  Neurological:     General: No focal deficit present.     Mental Status: He is alert.       Results for orders placed or performed in visit on 06/27/24  POC COVID-19  Result Value Ref Range   SARS Coronavirus 2 Ag Negative Negative  POCT Influenza A/B  Result Value Ref Range   Influenza A, POC Negative Negative   Influenza B, POC Negative Negative    Assessment & Plan     Problem List Items Addressed This Visit   None Visit Diagnoses       Acute maxillary sinusitis, recurrence not specified    -  Primary   Relevant Medications   amoxicillin -clavulanate (AUGMENTIN ) 875-125 MG tablet   Other Relevant Orders   POC COVID-19 (Completed)   POCT Influenza A/B (Completed)  Assessment & Plan Acute maxillary sinusitis Likely bacterial sinus infection following viral illness based on symptoms and time course. - Prescribed amoxicillin  for 7 days with food. - Recommended Mucinex for congestion - Continue Delsym for cough. - Follow up with PCP if no improvement   Meds ordered this encounter  Medications   amoxicillin -clavulanate (AUGMENTIN ) 875-125 MG tablet    Sig: Take 1 tablet by mouth 2 (two) times daily for 7 days.    Dispense:  14 tablet    Refill:  0     No follow-ups on file.      Isaiah DELENA Pepper, MD  [facility] [phone] (phone) [phone] (fax)

## 2024-06-27 NOTE — Telephone Encounter (Signed)
 FYI Only or Action Required?: FYI only for provider: appointment scheduled on 11/21/20205.  Patient was last seen in primary care on 12/28/2023 by Glenard Mire, MD.  Called Nurse Triage reporting Cough.  Symptoms began several weeks ago.  Interventions attempted: Nothing.  Symptoms are: gradually worsening.  Triage Disposition: See Physician Within 24 Hours  Patient/caregiver understands and will follow disposition?: Yes         Copied from CRM #[id number]. Topic: Clinical - Red Word Triage >> Jun 27, 2024  9:06 AM Nicolas White wrote: Red Word that prompted transfer to Nurse Triage: Patient is calling to reschedule today's appointment. He reports coughing for the past two weeks and continues to blow yellow mucus from his nose Reason for Disposition  [1] Continuous (nonstop) coughing interferes with work or school AND [2] no improvement using cough treatment per Care Advice  Answer Assessment - Initial Assessment Questions Pt already scheduled for an appointment today in office. Pt called requesting if there was any availability earlier in office. No other appointments open for today and pt states he will be at his appointment today at 4 pm.    1. ONSET: When did the cough begin?      2 weeks ago  2. SEVERITY: How bad is the cough today?      Moderate  3. SPUTUM: Describe the color of your sputum (e.g., none, dry cough; clear, white, yellow, green)     Yellow mucus  4. HEMOPTYSIS: Are you coughing up any blood? If Yes, ask: How much? (e.g., flecks, streaks, tablespoons, etc.)     Denies  5. DIFFICULTY BREATHING: Are you having difficulty breathing? If Yes, ask: How bad is it? (e.g., mild, moderate, severe)      Denies  6. FEVER: Do you have a fever? If Yes, ask: What is your temperature, how was it measured, and when did it start?     Denies  7. OTHER SYMPTOMS: Do you have any other symptoms? (e.g., runny nose, wheezing, chest pain)       Runny nose and  voice was hoarse  Protocols used: Cough - Acute Productive-A-AH

## 2024-06-30 ENCOUNTER — Ambulatory Visit: Payer: PRIVATE HEALTH INSURANCE | Admitting: Family Medicine

## 2024-08-14 NOTE — Patient Instructions (Incomplete)

## 2024-08-15 ENCOUNTER — Encounter: Admitting: Family Medicine

## 2024-08-25 ENCOUNTER — Other Ambulatory Visit: Payer: Self-pay | Admitting: Family Medicine

## 2024-08-25 DIAGNOSIS — E7800 Pure hypercholesterolemia, unspecified: Secondary | ICD-10-CM

## 2024-08-26 NOTE — Telephone Encounter (Signed)
 Requested medications are due for refill today.  yes  Requested medications are on the active medications list.  yes  Last refill. 05/05/2024 #90 0 rf  Future visit scheduled.   no  Notes to clinic.  Labs are expired. Pt need an OV. Pt has no showed 2 recent appts.    Requested Prescriptions  Pending Prescriptions Disp Refills   rosuvastatin  (CRESTOR ) 20 MG tablet [Pharmacy Med Name: Rosuvastatin  Calcium  20 MG Oral Tablet] 90 tablet 0    Sig: Take 1 tablet by mouth once daily     Cardiovascular:  Antilipid - Statins 2 Failed - 08/26/2024  8:30 AM      Failed - Cr in normal range and within 360 days    Creat  Date Value Ref Range Status  08/14/2023 1.01 0.60 - 1.29 mg/dL Final         Failed - Valid encounter within last 12 months    Recent Outpatient Visits           2 months ago Acute maxillary sinusitis, recurrence not specified   [facility] Isaiah LABOR, MD   8 months ago Vertigo   [facility] Montesano, Krichna, MD              Failed - Lipid Panel in normal range within the last 12 months    Cholesterol, Total  Date Value Ref Range Status  07/14/2022 172 100 - 199 mg/dL Final   Cholesterol  Date Value Ref Range Status  08/14/2023 165 <200 mg/dL Final   LDL Cholesterol (Calc)  Date Value Ref Range Status  08/14/2023 100 (H) mg/dL (calc) Final    Comment:    Reference range: <100 . Desirable range <100 mg/dL for primary prevention;   <70 mg/dL for patients with CHD or diabetic patients  with > or = 2 CHD risk factors. SABRA LDL-C is now calculated using the Martin-Hopkins  calculation, which is a validated novel method providing  better accuracy than the Friedewald equation in the  estimation of LDL-C.  Gladis APPLETHWAITE et al. SANDREA. 7986;689(80): 2061-2068  (http://education.[facility].com/faq/FAQ164)    HDL  Date Value Ref Range Status  08/14/2023 44 > OR = 40 mg/dL Final  87/91/7976 41 >60  mg/dL Final   Triglycerides  Date Value Ref Range Status  08/14/2023 113 <150 mg/dL Final         Passed - Patient is not pregnant

## 2024-08-26 NOTE — Telephone Encounter (Signed)
 Needs appt
